# Patient Record
Sex: Female | Born: 1945 | Race: White | Hispanic: No | Marital: Married | Smoking: Former smoker
Health system: Southern US, Community
[De-identification: ages and names within clinical notes are randomized; demographics above are authoritative.]

## PROBLEM LIST (undated history)

## (undated) DIAGNOSIS — E039 Hypothyroidism, unspecified: Secondary | ICD-10-CM

## (undated) DIAGNOSIS — R42 Dizziness and giddiness: Secondary | ICD-10-CM

## (undated) DIAGNOSIS — I639 Cerebral infarction, unspecified: Secondary | ICD-10-CM

## (undated) DIAGNOSIS — Z952 Presence of prosthetic heart valve: Secondary | ICD-10-CM

## (undated) DIAGNOSIS — M25559 Pain in unspecified hip: Secondary | ICD-10-CM

## (undated) DIAGNOSIS — E119 Type 2 diabetes mellitus without complications: Secondary | ICD-10-CM

## (undated) DIAGNOSIS — G8929 Other chronic pain: Secondary | ICD-10-CM

## (undated) DIAGNOSIS — R569 Unspecified convulsions: Secondary | ICD-10-CM

## (undated) DIAGNOSIS — IMO0001 Reserved for inherently not codable concepts without codable children: Secondary | ICD-10-CM

## (undated) DIAGNOSIS — I219 Acute myocardial infarction, unspecified: Secondary | ICD-10-CM

## (undated) DIAGNOSIS — G629 Polyneuropathy, unspecified: Secondary | ICD-10-CM

## (undated) DIAGNOSIS — K219 Gastro-esophageal reflux disease without esophagitis: Secondary | ICD-10-CM

## (undated) DIAGNOSIS — Z86718 Personal history of other venous thrombosis and embolism: Secondary | ICD-10-CM

## (undated) DIAGNOSIS — M797 Fibromyalgia: Secondary | ICD-10-CM

## (undated) HISTORY — PX: CAROTID ENDARTERECTOMY: SUR193

## (undated) HISTORY — PX: CARDIAC SURGERY: SHX584

## (undated) HISTORY — PX: CORONARY ARTERY BYPASS GRAFT: SHX141

## (undated) HISTORY — PX: DE QUERVAIN'S RELEASE: SHX1439

## (undated) HISTORY — DX: Personal history of other venous thrombosis and embolism: Z86.718

## (undated) HISTORY — DX: Presence of prosthetic heart valve: Z95.2

## (undated) HISTORY — PX: CARDIAC CATHETERIZATION: SHX172

## (undated) HISTORY — PX: CHOLECYSTECTOMY: SHX55

## (undated) HISTORY — PX: CARPAL TUNNEL RELEASE: SHX101

---

## 1973-01-13 HISTORY — PX: ABDOMINAL HYSTERECTOMY: SHX81

## 1991-01-14 DIAGNOSIS — R569 Unspecified convulsions: Secondary | ICD-10-CM

## 1991-01-14 HISTORY — DX: Unspecified convulsions: R56.9

## 1994-01-13 DIAGNOSIS — I219 Acute myocardial infarction, unspecified: Secondary | ICD-10-CM

## 1994-01-13 HISTORY — DX: Acute myocardial infarction, unspecified: I21.9

## 2002-11-30 ENCOUNTER — Other Ambulatory Visit: Payer: Self-pay

## 2002-12-03 ENCOUNTER — Other Ambulatory Visit: Payer: Self-pay

## 2004-02-23 ENCOUNTER — Ambulatory Visit: Payer: Self-pay | Admitting: Internal Medicine

## 2004-02-29 ENCOUNTER — Ambulatory Visit: Payer: Self-pay | Admitting: Internal Medicine

## 2004-07-19 ENCOUNTER — Ambulatory Visit: Payer: Self-pay | Admitting: Internal Medicine

## 2004-09-25 ENCOUNTER — Ambulatory Visit: Payer: Self-pay | Admitting: General Practice

## 2004-09-25 ENCOUNTER — Other Ambulatory Visit: Payer: Self-pay

## 2004-10-02 ENCOUNTER — Ambulatory Visit: Payer: Self-pay | Admitting: General Practice

## 2004-12-26 ENCOUNTER — Ambulatory Visit: Payer: Self-pay | Admitting: General Practice

## 2005-03-10 ENCOUNTER — Ambulatory Visit: Payer: Self-pay | Admitting: Family

## 2005-06-06 ENCOUNTER — Other Ambulatory Visit: Payer: Self-pay

## 2005-06-06 ENCOUNTER — Inpatient Hospital Stay: Payer: Self-pay | Admitting: Internal Medicine

## 2005-09-30 ENCOUNTER — Ambulatory Visit: Payer: Self-pay | Admitting: Gastroenterology

## 2005-10-04 ENCOUNTER — Other Ambulatory Visit: Payer: Self-pay

## 2005-10-04 ENCOUNTER — Emergency Department: Payer: Self-pay | Admitting: Unknown Physician Specialty

## 2005-12-01 ENCOUNTER — Emergency Department: Payer: Self-pay | Admitting: Emergency Medicine

## 2005-12-01 ENCOUNTER — Other Ambulatory Visit: Payer: Self-pay

## 2005-12-02 ENCOUNTER — Ambulatory Visit: Payer: Self-pay | Admitting: Cardiovascular Disease

## 2006-08-21 ENCOUNTER — Ambulatory Visit: Payer: Self-pay | Admitting: General Practice

## 2006-10-14 ENCOUNTER — Ambulatory Visit: Payer: Self-pay | Admitting: General Practice

## 2007-01-02 ENCOUNTER — Emergency Department: Payer: Self-pay | Admitting: Emergency Medicine

## 2007-02-12 ENCOUNTER — Other Ambulatory Visit: Payer: Self-pay

## 2007-02-12 ENCOUNTER — Inpatient Hospital Stay: Payer: Self-pay | Admitting: Cardiovascular Disease

## 2007-04-05 ENCOUNTER — Ambulatory Visit: Payer: Self-pay | Admitting: General Practice

## 2007-10-15 ENCOUNTER — Observation Stay: Payer: Self-pay | Admitting: Internal Medicine

## 2007-10-15 ENCOUNTER — Other Ambulatory Visit: Payer: Self-pay

## 2007-10-20 ENCOUNTER — Ambulatory Visit: Payer: Self-pay | Admitting: Internal Medicine

## 2007-11-05 HISTORY — PX: REPLACEMENT TOTAL KNEE: SUR1224

## 2008-11-13 ENCOUNTER — Ambulatory Visit: Payer: Self-pay | Admitting: Cardiovascular Disease

## 2009-04-04 HISTORY — PX: REPLACEMENT TOTAL KNEE: SUR1224

## 2009-05-07 ENCOUNTER — Encounter: Payer: Self-pay | Admitting: Orthopedic Surgery

## 2009-12-14 ENCOUNTER — Ambulatory Visit: Payer: Self-pay | Admitting: Internal Medicine

## 2010-01-01 ENCOUNTER — Ambulatory Visit: Payer: Self-pay | Admitting: Gastroenterology

## 2010-06-27 ENCOUNTER — Emergency Department: Payer: Self-pay | Admitting: Emergency Medicine

## 2010-09-04 ENCOUNTER — Emergency Department: Payer: Self-pay | Admitting: Emergency Medicine

## 2010-09-23 ENCOUNTER — Inpatient Hospital Stay: Payer: Self-pay | Admitting: Internal Medicine

## 2010-09-23 ENCOUNTER — Emergency Department: Payer: Self-pay | Admitting: Emergency Medicine

## 2011-01-14 DIAGNOSIS — I639 Cerebral infarction, unspecified: Secondary | ICD-10-CM

## 2011-01-14 HISTORY — DX: Cerebral infarction, unspecified: I63.9

## 2011-10-22 ENCOUNTER — Ambulatory Visit: Payer: Self-pay | Admitting: Pain Medicine

## 2011-10-22 ENCOUNTER — Other Ambulatory Visit: Payer: Self-pay | Admitting: Pain Medicine

## 2011-10-22 LAB — SEDIMENTATION RATE: Erythrocyte Sed Rate: 6 mm/hr (ref 0–20)

## 2011-10-22 LAB — FOLATE: Folic Acid: >100 ng/mL — ABNORMAL HIGH (ref 3.1–100.0)

## 2011-12-08 ENCOUNTER — Ambulatory Visit: Payer: Self-pay | Admitting: Pain Medicine

## 2011-12-31 ENCOUNTER — Ambulatory Visit: Payer: Self-pay | Admitting: Pain Medicine

## 2012-02-02 ENCOUNTER — Ambulatory Visit: Payer: Self-pay | Admitting: Pain Medicine

## 2012-02-10 LAB — URINALYSIS, COMPLETE
Bacteria: NONE SEEN
Bilirubin,UR: NEGATIVE
Blood: NEGATIVE
Glucose,UR: NEGATIVE mg/dL (ref 0–75)
Ketone: NEGATIVE
Nitrite: NEGATIVE
RBC,UR: 1 /HPF (ref 0–5)
Specific Gravity: 1.006 (ref 1.003–1.030)
WBC UR: 1 /HPF (ref 0–5)

## 2012-02-10 LAB — APTT: Activated PTT: 29.6 secs (ref 23.6–35.9)

## 2012-02-10 LAB — COMPREHENSIVE METABOLIC PANEL
Albumin: 3.7 g/dL (ref 3.4–5.0)
Alkaline Phosphatase: 129 U/L (ref 50–136)
Bilirubin,Total: 0.3 mg/dL (ref 0.2–1.0)
Calcium, Total: 9.1 mg/dL (ref 8.5–10.1)
Chloride: 103 mmol/L (ref 98–107)
Co2: 28 mmol/L (ref 21–32)
Creatinine: 1.32 mg/dL — ABNORMAL HIGH (ref 0.60–1.30)
Glucose: 137 mg/dL — ABNORMAL HIGH (ref 65–99)
Osmolality: 282 (ref 275–301)
SGOT(AST): 33 U/L (ref 15–37)
SGPT (ALT): 36 U/L (ref 12–78)
Sodium: 139 mmol/L (ref 136–145)
Total Protein: 7.1 g/dL (ref 6.4–8.2)

## 2012-02-10 LAB — PROTIME-INR
INR: 1
Prothrombin Time: 13.2 secs (ref 11.5–14.7)

## 2012-02-10 LAB — CBC
HCT: 40.2 % (ref 35.0–47.0)
HGB: 13.1 g/dL (ref 12.0–16.0)
MCH: 31 pg (ref 26.0–34.0)
MCHC: 32.7 g/dL (ref 32.0–36.0)
MCV: 95 fL (ref 80–100)
RBC: 4.23 10*6/uL (ref 3.80–5.20)
WBC: 7.8 10*3/uL (ref 3.6–11.0)

## 2012-02-10 LAB — TROPONIN I: Troponin-I: 0.03 ng/mL

## 2012-02-11 ENCOUNTER — Inpatient Hospital Stay: Payer: Self-pay | Admitting: Internal Medicine

## 2012-02-13 LAB — BASIC METABOLIC PANEL
Anion Gap: 10 (ref 7–16)
BUN: 18 mg/dL (ref 7–18)
Calcium, Total: 8.3 mg/dL — ABNORMAL LOW (ref 8.5–10.1)
Chloride: 105 mmol/L (ref 98–107)
Creatinine: 1.14 mg/dL (ref 0.60–1.30)
Glucose: 146 mg/dL — ABNORMAL HIGH (ref 65–99)
Osmolality: 284 (ref 275–301)
Potassium: 3.8 mmol/L (ref 3.5–5.1)
Sodium: 140 mmol/L (ref 136–145)

## 2012-03-03 ENCOUNTER — Inpatient Hospital Stay: Payer: Self-pay | Admitting: Internal Medicine

## 2012-03-03 LAB — COMPREHENSIVE METABOLIC PANEL
Albumin: 3.5 g/dL (ref 3.4–5.0)
Chloride: 99 mmol/L (ref 98–107)
Co2: 30 mmol/L (ref 21–32)
Creatinine: 1.33 mg/dL — ABNORMAL HIGH (ref 0.60–1.30)
EGFR (African American): 48 — ABNORMAL LOW
Osmolality: 275 (ref 275–301)
SGOT(AST): 103 U/L — ABNORMAL HIGH (ref 15–37)
SGPT (ALT): 59 U/L (ref 12–78)

## 2012-03-03 LAB — CBC
HGB: 12.9 g/dL (ref 12.0–16.0)
MCH: 31.1 pg (ref 26.0–34.0)
MCV: 95 fL (ref 80–100)
Platelet: 178 10*3/uL (ref 150–440)
RDW: 14.5 % (ref 11.5–14.5)
WBC: 13.8 10*3/uL — ABNORMAL HIGH (ref 3.6–11.0)

## 2012-03-03 LAB — TROPONIN I: Troponin-I: <0.02 ng/mL

## 2012-03-03 LAB — TSH: Thyroid Stimulating Horm: 0.838 u[IU]/mL

## 2012-03-04 LAB — CBC WITH DIFFERENTIAL/PLATELET
Basophil #: 0 10*3/uL (ref 0.0–0.1)
Eosinophil #: 0.1 10*3/uL (ref 0.0–0.7)
Eosinophil %: 1.2 %
HCT: 37.7 % (ref 35.0–47.0)
MCH: 31.2 pg (ref 26.0–34.0)
MCV: 94 fL (ref 80–100)
Monocyte #: 0.1 x10 3/mm — ABNORMAL LOW (ref 0.2–0.9)
Platelet: 201 10*3/uL (ref 150–440)
RBC: 4 10*6/uL (ref 3.80–5.20)
WBC: 10.7 10*3/uL (ref 3.6–11.0)

## 2012-03-04 LAB — BASIC METABOLIC PANEL
Anion Gap: 10 (ref 7–16)
BUN: 19 mg/dL — ABNORMAL HIGH (ref 7–18)
Co2: 30 mmol/L (ref 21–32)
EGFR (Non-African Amer.): 40 — ABNORMAL LOW
Glucose: 192 mg/dL — ABNORMAL HIGH (ref 65–99)
Osmolality: 279 (ref 275–301)

## 2012-03-04 LAB — CK TOTAL AND CKMB (NOT AT ARMC)
CK, Total: 930 U/L — ABNORMAL HIGH (ref 21–215)
CK-MB: 2.3 ng/mL (ref 0.5–3.6)

## 2012-03-04 LAB — MAGNESIUM: Magnesium: 2.5 mg/dL — ABNORMAL HIGH

## 2012-03-04 LAB — PRO B NATRIURETIC PEPTIDE: B-Type Natriuretic Peptide: 597 pg/mL — ABNORMAL HIGH (ref 0–125)

## 2012-03-06 LAB — BASIC METABOLIC PANEL
Anion Gap: 9 (ref 7–16)
BUN: 29 mg/dL — ABNORMAL HIGH (ref 7–18)
Calcium, Total: 8.8 mg/dL (ref 8.5–10.1)
Chloride: 98 mmol/L (ref 98–107)
Osmolality: 287 (ref 275–301)
Potassium: 3.5 mmol/L (ref 3.5–5.1)
Sodium: 140 mmol/L (ref 136–145)

## 2012-03-10 ENCOUNTER — Ambulatory Visit: Payer: Self-pay | Admitting: Vascular Surgery

## 2012-03-10 LAB — BASIC METABOLIC PANEL
Anion Gap: 8 (ref 7–16)
BUN: 27 mg/dL — ABNORMAL HIGH (ref 7–18)
Calcium, Total: 10.1 mg/dL (ref 8.5–10.1)
Chloride: 99 mmol/L (ref 98–107)
Creatinine: 1.98 mg/dL — ABNORMAL HIGH (ref 0.60–1.30)
Osmolality: 277 (ref 275–301)
Potassium: 3.6 mmol/L (ref 3.5–5.1)
Sodium: 135 mmol/L — ABNORMAL LOW (ref 136–145)

## 2012-03-12 ENCOUNTER — Observation Stay: Payer: Self-pay | Admitting: Internal Medicine

## 2012-03-12 LAB — COMPREHENSIVE METABOLIC PANEL
Alkaline Phosphatase: 81 U/L (ref 50–136)
BUN: 24 mg/dL — ABNORMAL HIGH (ref 7–18)
Bilirubin,Total: 0.3 mg/dL (ref 0.2–1.0)
Calcium, Total: 9.3 mg/dL (ref 8.5–10.1)
Chloride: 98 mmol/L (ref 98–107)
Co2: 37 mmol/L — ABNORMAL HIGH (ref 21–32)
EGFR (Non-African Amer.): 30 — ABNORMAL LOW
Glucose: 114 mg/dL — ABNORMAL HIGH (ref 65–99)
Osmolality: 279 (ref 275–301)
Potassium: 3.4 mmol/L — ABNORMAL LOW (ref 3.5–5.1)
Sodium: 137 mmol/L (ref 136–145)

## 2012-03-12 LAB — CBC
HGB: 12.3 g/dL (ref 12.0–16.0)
MCH: 31 pg (ref 26.0–34.0)
MCHC: 33.1 g/dL (ref 32.0–36.0)
MCV: 94 fL (ref 80–100)
Platelet: 168 10*3/uL (ref 150–440)
RBC: 3.96 10*6/uL (ref 3.80–5.20)
RDW: 14.2 % (ref 11.5–14.5)
WBC: 11.3 10*3/uL — ABNORMAL HIGH (ref 3.6–11.0)

## 2012-03-12 LAB — TROPONIN I: Troponin-I: <0.02 ng/mL

## 2012-03-12 LAB — CK TOTAL AND CKMB (NOT AT ARMC)
CK, Total: 41 U/L (ref 21–215)
CK-MB: 1.4 ng/mL (ref 0.5–3.6)

## 2012-03-13 LAB — CK TOTAL AND CKMB (NOT AT ARMC)
CK, Total: 39 U/L (ref 21–215)
CK-MB: 1.7 ng/mL (ref 0.5–3.6)
CK-MB: 1.8 ng/mL (ref 0.5–3.6)

## 2012-03-13 LAB — TROPONIN I
Troponin-I: <0.02 ng/mL
Troponin-I: <0.02 ng/mL

## 2012-03-22 ENCOUNTER — Observation Stay: Payer: Self-pay | Admitting: Internal Medicine

## 2012-03-22 LAB — CBC
MCH: 31.1 pg (ref 26.0–34.0)
MCV: 93 fL (ref 80–100)
Platelet: 178 10*3/uL (ref 150–440)
RBC: 3.59 10*6/uL — ABNORMAL LOW (ref 3.80–5.20)

## 2012-03-22 LAB — URINALYSIS, COMPLETE
Bacteria: NONE SEEN
Glucose,UR: NEGATIVE mg/dL (ref 0–75)
Ketone: NEGATIVE
Leukocyte Esterase: NEGATIVE
Nitrite: NEGATIVE
Protein: NEGATIVE
RBC,UR: 1 /HPF (ref 0–5)
Specific Gravity: 1.011 (ref 1.003–1.030)
Squamous Epithelial: NONE SEEN
WBC UR: <1 /HPF (ref 0–5)

## 2012-03-22 LAB — CK: CK, Total: 160 U/L (ref 21–215)

## 2012-03-22 LAB — COMPREHENSIVE METABOLIC PANEL
Albumin: 3.1 g/dL — ABNORMAL LOW (ref 3.4–5.0)
Anion Gap: 8 (ref 7–16)
Calcium, Total: 8.6 mg/dL (ref 8.5–10.1)
Chloride: 101 mmol/L (ref 98–107)
Creatinine: 2.2 mg/dL — ABNORMAL HIGH (ref 0.60–1.30)
EGFR (Non-African Amer.): 23 — ABNORMAL LOW
Potassium: 4.6 mmol/L (ref 3.5–5.1)
SGPT (ALT): 18 U/L (ref 12–78)
Sodium: 132 mmol/L — ABNORMAL LOW (ref 136–145)
Total Protein: 7 g/dL (ref 6.4–8.2)

## 2012-03-22 LAB — CK-MB: CK-MB: 9.7 ng/mL — ABNORMAL HIGH (ref 0.5–3.6)

## 2012-03-22 LAB — TROPONIN I: Troponin-I: <0.02 ng/mL

## 2012-03-23 LAB — BASIC METABOLIC PANEL
Anion Gap: 4 — ABNORMAL LOW (ref 7–16)
BUN: 23 mg/dL — ABNORMAL HIGH (ref 7–18)
Calcium, Total: 7.9 mg/dL — ABNORMAL LOW (ref 8.5–10.1)
Chloride: 110 mmol/L — ABNORMAL HIGH (ref 98–107)
Creatinine: 1.55 mg/dL — ABNORMAL HIGH (ref 0.60–1.30)
EGFR (African American): 40 — ABNORMAL LOW
EGFR (Non-African Amer.): 35 — ABNORMAL LOW
Glucose: 129 mg/dL — ABNORMAL HIGH (ref 65–99)
Osmolality: 281 (ref 275–301)
Potassium: 3.9 mmol/L (ref 3.5–5.1)
Sodium: 138 mmol/L (ref 136–145)

## 2012-03-23 LAB — CK-MB: CK-MB: 3.3 ng/mL (ref 0.5–3.6)

## 2012-03-23 LAB — TROPONIN I: Troponin-I: <0.02 ng/mL

## 2012-03-24 LAB — BASIC METABOLIC PANEL
Anion Gap: 11 (ref 7–16)
BUN: 14 mg/dL (ref 7–18)
Calcium, Total: 7.8 mg/dL — ABNORMAL LOW (ref 8.5–10.1)
Chloride: 109 mmol/L — ABNORMAL HIGH (ref 98–107)
Co2: 18 mmol/L — ABNORMAL LOW (ref 21–32)
Creatinine: 1.28 mg/dL (ref 0.60–1.30)
EGFR (Non-African Amer.): 44 — ABNORMAL LOW
Glucose: 121 mg/dL — ABNORMAL HIGH (ref 65–99)
Osmolality: 277 (ref 275–301)
Potassium: 4.2 mmol/L (ref 3.5–5.1)

## 2012-04-07 ENCOUNTER — Ambulatory Visit: Payer: Self-pay | Admitting: Vascular Surgery

## 2012-04-07 LAB — CBC
HCT: 36.3 % (ref 35.0–47.0)
MCH: 31.1 pg (ref 26.0–34.0)
MCV: 93 fL (ref 80–100)
RBC: 3.89 10*6/uL (ref 3.80–5.20)

## 2012-04-07 LAB — BASIC METABOLIC PANEL
BUN: 28 mg/dL — ABNORMAL HIGH (ref 7–18)
Calcium, Total: 8.4 mg/dL — ABNORMAL LOW (ref 8.5–10.1)
Creatinine: 1.59 mg/dL — ABNORMAL HIGH (ref 0.60–1.30)
EGFR (African American): 39 — ABNORMAL LOW
EGFR (Non-African Amer.): 33 — ABNORMAL LOW
Glucose: 151 mg/dL — ABNORMAL HIGH (ref 65–99)
Potassium: 4.5 mmol/L (ref 3.5–5.1)

## 2012-04-21 ENCOUNTER — Inpatient Hospital Stay: Payer: Self-pay | Admitting: Vascular Surgery

## 2012-04-22 LAB — BASIC METABOLIC PANEL
Anion Gap: 5 — ABNORMAL LOW (ref 7–16)
Calcium, Total: 7.5 mg/dL — ABNORMAL LOW (ref 8.5–10.1)
Chloride: 105 mmol/L (ref 98–107)
Co2: 30 mmol/L (ref 21–32)
EGFR (African American): 40 — ABNORMAL LOW
EGFR (Non-African Amer.): 34 — ABNORMAL LOW
Osmolality: 282 (ref 275–301)
Potassium: 3.7 mmol/L (ref 3.5–5.1)

## 2012-04-22 LAB — CBC WITH DIFFERENTIAL/PLATELET
Basophil #: 0 10*3/uL (ref 0.0–0.1)
Basophil %: 0.2 %
Eosinophil %: 1.9 %
MCHC: 33.7 g/dL (ref 32.0–36.0)
Monocyte #: 0.4 x10 3/mm (ref 0.2–0.9)
Monocyte %: 6.2 %
Neutrophil %: 74.4 %
WBC: 6.3 10*3/uL (ref 3.6–11.0)

## 2012-04-22 LAB — PROTIME-INR
INR: 1.1
Prothrombin Time: 14.7 secs (ref 11.5–14.7)

## 2012-04-30 ENCOUNTER — Ambulatory Visit: Payer: Self-pay | Admitting: Pain Medicine

## 2012-06-02 ENCOUNTER — Ambulatory Visit: Payer: Self-pay | Admitting: Pain Medicine

## 2012-08-30 ENCOUNTER — Ambulatory Visit: Payer: Self-pay | Admitting: Pain Medicine

## 2012-09-09 ENCOUNTER — Ambulatory Visit: Payer: Self-pay | Admitting: Internal Medicine

## 2012-12-17 ENCOUNTER — Ambulatory Visit: Payer: Self-pay | Admitting: Pain Medicine

## 2013-03-23 ENCOUNTER — Ambulatory Visit: Payer: Self-pay | Admitting: Pain Medicine

## 2013-04-06 ENCOUNTER — Ambulatory Visit: Payer: Self-pay | Admitting: Neurology

## 2013-04-06 ENCOUNTER — Observation Stay: Payer: Self-pay | Admitting: Internal Medicine

## 2013-04-06 LAB — COMPREHENSIVE METABOLIC PANEL
ALK PHOS: 90 U/L
ALT: 23 U/L (ref 12–78)
Albumin: 3.7 g/dL (ref 3.4–5.0)
Anion Gap: 4 — ABNORMAL LOW (ref 7–16)
BILIRUBIN TOTAL: 0.3 mg/dL (ref 0.2–1.0)
BUN: 25 mg/dL — ABNORMAL HIGH (ref 7–18)
CALCIUM: 9.2 mg/dL (ref 8.5–10.1)
CHLORIDE: 98 mmol/L (ref 98–107)
Co2: 34 mmol/L — ABNORMAL HIGH (ref 21–32)
Creatinine: 1.86 mg/dL — ABNORMAL HIGH (ref 0.60–1.30)
EGFR (African American): 32 — ABNORMAL LOW
GFR CALC NON AF AMER: 28 — AB
Glucose: 87 mg/dL (ref 65–99)
Osmolality: 276 (ref 275–301)
Potassium: 4.3 mmol/L (ref 3.5–5.1)
SGOT(AST): 31 U/L (ref 15–37)
Sodium: 136 mmol/L (ref 136–145)
Total Protein: 6.9 g/dL (ref 6.4–8.2)

## 2013-04-06 LAB — URINALYSIS, COMPLETE
BACTERIA: NONE SEEN
Bilirubin,UR: NEGATIVE
Blood: NEGATIVE
GLUCOSE, UR: NEGATIVE mg/dL (ref 0–75)
Ketone: NEGATIVE
Leukocyte Esterase: NEGATIVE
NITRITE: NEGATIVE
PROTEIN: NEGATIVE
Ph: 6 (ref 4.5–8.0)
RBC,UR: <1 /HPF (ref 0–5)
SPECIFIC GRAVITY: 1.009 (ref 1.003–1.030)
Squamous Epithelial: <1
WBC UR: <1 /HPF (ref 0–5)

## 2013-04-06 LAB — APTT: ACTIVATED PTT: 29.8 s (ref 23.6–35.9)

## 2013-04-06 LAB — CBC WITH DIFFERENTIAL/PLATELET
BASOS PCT: 0.8 %
Basophil #: 0.1 10*3/uL (ref 0.0–0.1)
EOS PCT: 2.8 %
Eosinophil #: 0.2 10*3/uL (ref 0.0–0.7)
HCT: 40.5 % (ref 35.0–47.0)
HGB: 13.6 g/dL (ref 12.0–16.0)
LYMPHS ABS: 2.9 10*3/uL (ref 1.0–3.6)
Lymphocyte %: 35.3 %
MCH: 31.4 pg (ref 26.0–34.0)
MCHC: 33.4 g/dL (ref 32.0–36.0)
MCV: 94 fL (ref 80–100)
MONOS PCT: 11 %
Monocyte #: 0.9 x10 3/mm (ref 0.2–0.9)
Neutrophil #: 4.1 10*3/uL (ref 1.4–6.5)
Neutrophil %: 50.1 %
Platelet: 180 10*3/uL (ref 150–440)
RBC: 4.31 10*6/uL (ref 3.80–5.20)
RDW: 14.9 % — ABNORMAL HIGH (ref 11.5–14.5)
WBC: 8.1 10*3/uL (ref 3.6–11.0)

## 2013-04-06 LAB — PROTIME-INR
INR: 1
Prothrombin Time: 12.9 secs (ref 11.5–14.7)

## 2013-04-06 LAB — DRUG SCREEN, URINE

## 2013-04-06 LAB — TROPONIN I: Troponin-I: <0.02 ng/mL

## 2013-04-06 LAB — AMMONIA: Ammonia, Plasma: 25 mcmol/L (ref 11–32)

## 2013-07-08 ENCOUNTER — Ambulatory Visit: Payer: Self-pay | Admitting: Pain Medicine

## 2013-07-13 DIAGNOSIS — E65 Localized adiposity: Secondary | ICD-10-CM | POA: Insufficient documentation

## 2013-07-27 ENCOUNTER — Ambulatory Visit: Payer: Self-pay | Admitting: Pain Medicine

## 2013-08-08 ENCOUNTER — Emergency Department: Payer: Self-pay | Admitting: Emergency Medicine

## 2013-08-08 LAB — URINALYSIS, COMPLETE
Bilirubin,UR: NEGATIVE
Blood: NEGATIVE
Glucose,UR: NEGATIVE mg/dL (ref 0–75)
KETONE: NEGATIVE
Nitrite: NEGATIVE
PROTEIN: NEGATIVE
Ph: 8 (ref 4.5–8.0)
RBC,UR: 1 /HPF (ref 0–5)
Specific Gravity: 1.009 (ref 1.003–1.030)
Squamous Epithelial: <1
Transitional Epi: <1
WBC UR: 6 /HPF (ref 0–5)

## 2013-08-12 ENCOUNTER — Ambulatory Visit: Payer: Self-pay | Admitting: Pain Medicine

## 2013-08-24 ENCOUNTER — Ambulatory Visit: Payer: Self-pay | Admitting: Pain Medicine

## 2013-08-24 LAB — BASIC METABOLIC PANEL
ANION GAP: 3 — AB (ref 7–16)
BUN: 26 mg/dL — ABNORMAL HIGH (ref 7–18)
CALCIUM: 9.5 mg/dL (ref 8.5–10.1)
CO2: 34 mmol/L — AB (ref 21–32)
Chloride: 95 mmol/L — ABNORMAL LOW (ref 98–107)
Creatinine: 1.83 mg/dL — ABNORMAL HIGH (ref 0.60–1.30)
EGFR (Non-African Amer.): 28 — ABNORMAL LOW
GFR CALC AF AMER: 33 — AB
GLUCOSE: 299 mg/dL — AB (ref 65–99)
Osmolality: 280 (ref 275–301)
Potassium: 4.6 mmol/L (ref 3.5–5.1)
Sodium: 132 mmol/L — ABNORMAL LOW (ref 136–145)

## 2013-08-24 LAB — CBC WITH DIFFERENTIAL/PLATELET
BASOS PCT: 0.4 %
Basophil #: 0 10*3/uL (ref 0.0–0.1)
Eosinophil #: 0.1 10*3/uL (ref 0.0–0.7)
Eosinophil %: 1 %
HCT: 38.3 % (ref 35.0–47.0)
HGB: 12.3 g/dL (ref 12.0–16.0)
LYMPHS PCT: 13.5 %
Lymphocyte #: 1.2 10*3/uL (ref 1.0–3.6)
MCH: 30.3 pg (ref 26.0–34.0)
MCHC: 32 g/dL (ref 32.0–36.0)
MCV: 95 fL (ref 80–100)
MONO ABS: 0.4 x10 3/mm (ref 0.2–0.9)
Monocyte %: 4.7 %
NEUTROS PCT: 80.4 %
Neutrophil #: 7.4 10*3/uL — ABNORMAL HIGH (ref 1.4–6.5)
PLATELETS: 171 10*3/uL (ref 150–440)
RBC: 4.05 10*6/uL (ref 3.80–5.20)
RDW: 15.1 % — AB (ref 11.5–14.5)
WBC: 9.2 10*3/uL (ref 3.6–11.0)

## 2013-08-25 ENCOUNTER — Ambulatory Visit: Payer: Self-pay | Admitting: Pain Medicine

## 2013-08-30 ENCOUNTER — Ambulatory Visit: Payer: Self-pay | Admitting: Pain Medicine

## 2013-09-02 LAB — PATHOLOGY REPORT

## 2013-09-23 ENCOUNTER — Ambulatory Visit: Payer: Self-pay | Admitting: Pain Medicine

## 2013-09-27 ENCOUNTER — Ambulatory Visit: Payer: Self-pay | Admitting: Pain Medicine

## 2013-10-17 ENCOUNTER — Ambulatory Visit: Payer: Self-pay | Admitting: Pain Medicine

## 2013-10-20 ENCOUNTER — Ambulatory Visit: Payer: Self-pay | Admitting: Pain Medicine

## 2013-10-27 ENCOUNTER — Ambulatory Visit: Payer: Self-pay | Admitting: Pain Medicine

## 2013-11-29 ENCOUNTER — Emergency Department: Payer: Self-pay | Admitting: Emergency Medicine

## 2013-11-29 ENCOUNTER — Ambulatory Visit: Payer: Self-pay | Admitting: Pain Medicine

## 2013-11-29 LAB — CBC WITH DIFFERENTIAL/PLATELET
BASOS ABS: 0 10*3/uL (ref 0.0–0.1)
Basophil %: 0.5 %
Eosinophil #: 0.2 10*3/uL (ref 0.0–0.7)
Eosinophil %: 2.6 %
HCT: 40.9 % (ref 35.0–47.0)
HGB: 13 g/dL (ref 12.0–16.0)
LYMPHS ABS: 1.5 10*3/uL (ref 1.0–3.6)
LYMPHS PCT: 21 %
MCH: 29.4 pg (ref 26.0–34.0)
MCHC: 31.9 g/dL — ABNORMAL LOW (ref 32.0–36.0)
MCV: 92 fL (ref 80–100)
MONO ABS: 0.5 x10 3/mm (ref 0.2–0.9)
MONOS PCT: 7.4 %
Neutrophil #: 5 10*3/uL (ref 1.4–6.5)
Neutrophil %: 68.5 %
Platelet: 165 10*3/uL (ref 150–440)
RBC: 4.43 10*6/uL (ref 3.80–5.20)
RDW: 15.9 % — ABNORMAL HIGH (ref 11.5–14.5)
WBC: 7.3 10*3/uL (ref 3.6–11.0)

## 2013-11-29 LAB — BASIC METABOLIC PANEL
Anion Gap: 3 — ABNORMAL LOW (ref 7–16)
BUN: 19 mg/dL — AB (ref 7–18)
CO2: 35 mmol/L — AB (ref 21–32)
CREATININE: 1.67 mg/dL — AB (ref 0.60–1.30)
Calcium, Total: 9.1 mg/dL (ref 8.5–10.1)
Chloride: 98 mmol/L (ref 98–107)
GFR CALC AF AMER: 39 — AB
GFR CALC NON AF AMER: 32 — AB
GLUCOSE: 184 mg/dL — AB (ref 65–99)
OSMOLALITY: 279 (ref 275–301)
Potassium: 4.1 mmol/L (ref 3.5–5.1)
Sodium: 136 mmol/L (ref 136–145)

## 2013-11-29 LAB — TROPONIN I: Troponin-I: <0.02 ng/mL

## 2013-12-22 DIAGNOSIS — F329 Major depressive disorder, single episode, unspecified: Secondary | ICD-10-CM | POA: Insufficient documentation

## 2013-12-22 DIAGNOSIS — Z8673 Personal history of transient ischemic attack (TIA), and cerebral infarction without residual deficits: Secondary | ICD-10-CM | POA: Insufficient documentation

## 2013-12-22 DIAGNOSIS — I2510 Atherosclerotic heart disease of native coronary artery without angina pectoris: Secondary | ICD-10-CM | POA: Insufficient documentation

## 2013-12-22 DIAGNOSIS — F32A Depression, unspecified: Secondary | ICD-10-CM | POA: Insufficient documentation

## 2014-01-06 ENCOUNTER — Emergency Department: Payer: Self-pay | Admitting: Emergency Medicine

## 2014-02-02 ENCOUNTER — Emergency Department: Payer: Self-pay | Admitting: Emergency Medicine

## 2014-02-11 ENCOUNTER — Emergency Department: Payer: Self-pay | Admitting: Emergency Medicine

## 2014-05-05 NOTE — Discharge Summary (Signed)
PATIENT NAME:  Pamela Mcmahon, Pamela Mcmahon MR#:  [id number] DATE OF BIRTH:  04/26/1945  DATE OF ADMISSION:  03/03/2012 DATE OF DISCHARGE:  03/07/2012  HISTORY OF PRESENT ILLNESS: The patient is a 69 year old white lady who came to the Emergency Room with a chief complaint of shortness of breath associated with cough. She had cough with yellow sputum production. She had also had nausea and vomiting. She had been on amoxicillin as an outpatient for 2 days with no improvement. In the Emergency Room, the patient was found to be hypoxic on room air. Chest x-ray revealed mild interstitial edema as well as a possible interstitial infiltrate. The patient was given Lasix and IV Levaquin in the ER. She was also given IV steroids.   The patient had recently been admitted to the hospital with what was felt to be an acute CVA. She was found to have high-grade stenosis of the right carotid artery. She had been seen in consultation by vascular surgery and was being considered for possible stent placement.   PAST MEDICAL HISTORY: Type 2 diabetes, hypertension, history of CVA, carotid artery stenosis, coronary artery disease, hyperlipidemia, chronic pain syndrome, hypothyroidism, chronic anxiety and depression.   PAST SURGICAL HISTORY: Previous coronary artery bypass graft.   DRUG ALLERGIES: CODEINE.   MEDICATIONS AT HOME: Alprazolam 0.5 mg once daily, amitriptyline 100 mg daily, furosemide 40 mg daily, Glyset 25 mg b.i.d., isosorbide 30 mg daily, Levoxyl 50 mcg daily, metformin 1000 mg b.i.d., metoprolol succinate 25 mg daily, omeprazole 40 mg daily, paroxetine 40 mg daily, Percocet 7.5/325, 1 tablet every 6 hours as needed for pain; Plavix 75 mg daily, potassium chloride 20 mEq daily, simvastatin 40 mg at bedtime and zolpidem 10 mg at bedtime p.r.n.   ADMISSION PHYSICAL EXAMINATION: As described by the admitting physician revealed temperature of 100.5, pulse 88, respirations 22 and blood pressure 118/53. Pulse oximetry was 95%  on 4 liters. Exam as described by the physician who admitted her was notable for a right carotid bruit. Auscultation of the chest revealed crackles and rales. Cardiac exam was felt to be unremarkable. The patient had no peripheral edema.   ADMISSION LABORATORY: Showed a blood sugar of 111. BNP of 719. BUN of 20, creatinine of 1.33, sodium of 136, potassium of 5.1, chloride of 99, CO2 of 30, an estimated GFR of 42, anion gap of 7. Magnesium was 0.9. Alkaline phosphatase 146, AST 103, ALT 15. Troponin was less than 0.02. TSH was 0.838. White count was 13,800, hemoglobin was 12.9 with a hematocrit of 39.3, platelet count was 178,000. EKG showed a sinus tachycardia with left axis deviation and incomplete right bundle branch block. Chest x-ray revealed diffuse interstitial infiltrates and possible interstitial edema.   HOSPITAL COURSE: The patient was admitted to the regular medical floor where she was placed on telemetry. She was treated with IV Lasix for her congestive heart failure. She was felt to have a healthcare-associated pneumonia, as she had recently been in the hospital, and she was placed on both Zosyn and levofloxacin. Her hypomagnesemia was replaced with supplements. Her acute renal insufficiency was felt to be due to dehydration, and she was rehydrated with IV fluids. She was continued on aspirin and statin for her heart disease. The patient's hospital course was one of slow but gradual improvement. She was eventually weaned from oxygen supplement and ambulating.   DISCHARGE DIAGNOSES:  1.  Acute respiratory failure secondary to acute systolic congestive heart failure.  2.  Health facility-acquired pneumonia.   DISCHARGE MEDICATIONS:  1.  Furosemide 40 mg daily.  2.  Glyset 25 mg b.i.d.  3.  Levoxyl 50 mcg daily.  4.  Zolpidem 10 mg at bedtime p.r.n.  5.  Imdur 30 mg daily.  6.  Metoprolol succinate 25 mg daily.  7.  Omeprazole 40 mg daily.  8.  Simvastatin 40 mg at bedtime.  9.   Metformin 1000 mg b.i.d.  10.  Amitriptyline 100 mg at bedtime.  11.  Alprazolam 0.5 mg at bedtime.  12.  Fluoxetine 40 mg daily.  13.  Percocet 7.5/325, 1 tablet every 6 hours as needed for pain.  14.  Potassium chloride 20 mEq daily.  15.  Nitroglycerin 0.4 mg sublingual tablets p.r.n.  16.  Levofloxacin 750 mg every 48 hours.   PLAN: The patient was advised to stop taking her Plavix. She was also taken off of her Augmentin.   DISCHARGE DISPOSITION: The patient was discharged home with home health. She was discharged on a low-sodium, carbohydrate-controlled diet. She is to have activity as tolerated. She will be followed up in the office in 2 to 4 weeks.    ____________________________ Letta PateJohn B. Danne HarborWalker III, MD jbw:lo D: 03/23/2012 10:11:18 ET T: 03/23/2012 11:05:50 ET JOB#: [id number]  cc: Letta PateJohn B. Danne HarborWalker III, MD, <Dictator> Elmo PuttJOHN B WALKER III MD ELECTRONICALLY SIGNED 03/23/2012 12:50

## 2014-05-05 NOTE — Discharge Summary (Signed)
PATIENT NAME:  Pamela Mcmahon, Pamela Mcmahon MR#:  [id number] DATE OF BIRTH:  1945/05/19  DATE OF ADMISSION:  03/22/2012 DATE OF DISCHARGE:  03/24/2012  The patient is a 69 year old white lady who had recently been admitted to the hospital with a CVA. She had been readmitted for possible stent placement in the right carotid that was unsuccessful, and she went back home, to be brought back for a carotid endarterectomy. However, on the morning of admission she was found on the floor at home by her husband after no apparent syncopal episode. She was brought to the Emergency Room where she was found to be hypotensive and dehydrated. Her creatinine had gone from 1.6 to 2.3. The patient was therefore admitted to observation for IV rehydration.   The patient'Mcmahon past medical history was notable for coronary artery disease, with a previous, CABG procedure. The patient had type 2 diabetes, with a history of peripheral neuropathy. The patient had known chronic anxiety, depression. As noted, she had had a recent CVA and was found to have significant carotid artery disease. The patient was also known to have hyperlipidemia, hypothyroidism, and gastroesophageal reflux disease.   Surgical history included a left knee replacement, a right knee arthroscopy, a colon resection, a hysterectomy, and a previous cholecystectomy.   MEDICATIONS ON ADMISSION: Included Zolpidem 10 mg at bedtime p.r.n., simvastatin 40 mg at bedtime, Zetia 10 mg daily, Ranolazine 500 mg b.i.d., potassium chloride 20 mEq daily, Paxil 40 mg daily, Protonix 40 mg daily, oxycodone 1 tablet 3 times a day as needed, nitroglycerin 0.4 mg sublingual p.r.n., nitroglycerin 0.4 mg topical daily, metoprolol 25 mg daily, Levoxyl 50 mg daily, Glyset 25 mg b.i.d., Lasix 40 mg daily, Plavix 75 mg daily, vitamin D 2000 units daily, amitriptyline 100 mg at bedtime, Apresoline 0.5 mg at bedtime.    Patient'Mcmahon admission vital signs nor physical exam were described by the admitting  physician in his admission note.   HOSPITAL COURSE: The patient was admitted to the regular floor where she was rehydrated with IV fluids. She was also seen by Physical Therapy, who recommended rehab as she had been having multiple falls at home.     The patient'Mcmahon admission CBC showed a hemoglobin of 11.1 with a hematocrit of 33.2. White count was 12,500. Platelet count was 178,000.   Admission comprehensive metabolic panel showed a blood sugar of 108, a BUN of 34, creatinine of 2.2, a sodium of 132, an albumin of 3.1, and an estimated GFR of 23. Troponin on admission was normal. Following rehydration, the patient'Mcmahon basic metabolic panel showed a BUN of 14 with a creatinine of 1.28. Chloride was 109. CO2 was 18. Calcium was 7.8. Estimated GFR was 44.   DISCHARGE DIAGNOSIS: Dehydration.   IMPRESSION: Acute renal insufficiency, secondary to dehydration.   DISCHARGE MEDICATIONS: 1.  Glyset 25 mg b.i.d.  2.  Levoxyl 50 mg daily.  3.  Zolpidem 10 mg at bedtime, p.r.n.  4.  Protonix 40 mg daily.  5.  Ranexa 500 mg b.i.d.  6.  Paroxetine 40 mg daily.  7.  Metoprolol succinate 25 mg daily.  8.  Amitriptyline 100 mg at bedtime.  9.  Simvastatin 40 mg at bedtime.  10.  Zetia 10 mg daily.  11.  Plavix 75 mg daily.  12.  Alprazolam 0.5 mg at bedtime   13.  Calciferol 200 units daily.  14.  Oxycodone 5 mg 3 times a day, as needed.  15.  Senna 1 tablet twice a day.  16.  Colace 100 mg twice a day.   The patient'Mcmahon diuretic and potassium supplement were discontinued.      Patient is being discharged on a low-sodium, low-fat, diabetic diet. She is to have activity as tolerated.   The patient is a FULL CODE.   It is anticipated that she will return home in 1 to 2 weeks.     ____________________________ Letta Pate Danne Harbor, MD jbw:dm D: 03/24/2012 12:39:43 ET T: 03/24/2012 13:46:40 ET JOB#: [id number]  cc: Letta Pate. Danne Harbor, MD, <Dictator> Elmo Putt III MD ELECTRONICALLY SIGNED  04/05/2012 20:57

## 2014-05-05 NOTE — Consult Note (Signed)
General Aspect critical stenosis of the right ICA    Present Illness The patient is a 69 year old female, who presents to the Emergency Room due to sudden onset of left-sided arm weakness and slurred speech approximately two days ago. The patient said she woke up this morning, and shortly after breakfast around 10:00, she started feeling that her left arm was not working very well. She called her husband, and the husband said that he could not understand what she was saying, that she was speaking in a garbled speech manner. Her speech since then has improved, but she continues to have left-sided arm weakness. She denies any numbness in the left upper extremity, denies any left lower extremity weakness or any left-sided facial weakness. She denies any headache, any nausea, vomiting, chest pain, shortness of breath or any other associated symptoms. Work up has included a CT angiogram which is read as >75% stenosis.  PAST MEDICAL HISTORY:  Consistent with diabetes, hypertension, history of previous CVA, history of coronary artery disease, status post CABG, hyperlipidemia, chronic pain syndrome, hypothyroidism, anxiety/depression.   Home Medications: Medication Instructions Status  Plavix 75 mg oral tablet 1 tab(s) orally once a day  Active  amitriptyline 100 mg oral tablet 1 tab(s) orally once a day (at bedtime) Active  fluoxetine 20 mg oral capsule 1 cap(s) orally once a day Active  furosemide 40 mg oral tablet 1 tab(s) orally once a day Active  Glyset 25 mg oral tablet 1 tab(s) orally 2 times a day Active  levothyroxine 50 mcg (0.05 mg) oral tablet 1 tab(s) orally once a day Active  nitroglycerin 0.4 mg sublingual tablet 1 tab(s) sublingual every 5 minutes as needed up to 3 doses for chest pain. **if no relief call md or go to emergency room** Active  alprazolam 0.5 mg oral tablet 1 tab(s) orally 3 times a day Active  zolpidem 10 mg oral tablet 1 tab(s) orally once a day (at bedtime) Active   isosorbide mononitrate 30 mg oral tablet, extended release 1 tab(s) orally once a day Active  metoprolol succinate 25 mg oral tablet, extended release 1 tab(s) orally once a day (in the morning) Active  omeprazole 40 mg oral delayed release capsule 1 cap(s) orally once a day Active  simvastatin 40 mg oral tablet 1 tab(s) orally once a day (at bedtime) Active  aspirin 81 mg oral tablet 1 tab(s) orally once a day Active  metformin 1000 mg oral tablet 1 tab(s) orally 2 times a day Active  oxycodone 5 mg oral tablet 1 tab(s) orally 1 to 4 times a day, As Needed- for Pain  Active  potassium chloride 20 mEq oral tablet, extended release 1 tab(s) orally once a day Active  Vitamin D3 2000 intl units oral tablet 1 tab(s) orally once a day Active    Codeine: N/V  Case History:   Family History Non-Contributory    Social History positive  tobacco, negative ETOH, negative Illicit drugs   Review of Systems:   Fever/Chills No    Cough No    Sputum No    Abdominal Pain No    Diarrhea No    Constipation No    Nausea/Vomiting No    SOB/DOE No    Chest Pain No    Telemetry Reviewed NSR    Dysuria No   Physical Exam:   GEN well developed, well nourished, no acute distress    HEENT pink conjunctivae, PERRL, hearing intact to voice    NECK supple  trachea midline    RESP normal resp effort  no use of accessory muscles    CARD regular rate  positive carotid bruits  no JVD    ABD denies tenderness  denies Flank Tenderness  soft    EXTR negative cyanosis/clubbing, negative edema    SKIN No rashes, skin turgor poor    NEURO cranial nerves intact, L side weakness, follows commands    PSYCH alert, good insight   Nursing/Ancillary Notes: **Vital Signs.:   31-Jan-14 04:02   Vital Signs Type Routine   Temperature Temperature (F) 98.2   Celsius 36.7   Temperature Source Oral   Pulse Pulse 70   Respirations Respirations 18   Systolic BP Systolic BP 133   Diastolic BP  (mmHg) Diastolic BP (mmHg) 65   Mean BP 87   Pulse Ox % Pulse Ox % 92   Pulse Ox Activity Level  At rest   Oxygen Delivery Room Air/ 21 %   Routine Chem:  31-Jan-14 04:54    Glucose, Serum  146   BUN 18   Creatinine (comp) 1.14   Sodium, Serum 140   Potassium, Serum 3.8   Chloride, Serum 105   CO2, Serum 25   Calcium (Total), Serum  8.3   Anion Gap 10 (Result(s) reported on 13 Feb 2012 at 05:17AM.)   Osmolality (calc) 284     Impression 1.  A cerebrovascular accident.  The CT angiogram shows a 90% stenosis of the right ICA by my read.  This is a symptomatic lesion and given the patient's persistent left arm weakness she has had a stroke.  I would rest her brain for 2-3 weeks.  She will need cardiac clearance, she sees Dr Welton Flakes, and pending the results of his evaluation right CEA vs stenting.  Continue antiplatelet therapy as ordered.  2.  Diabetes. Continue metformin and sliding scale insulin for now.  3.  Hypertension. Continue Toprol, continue Imdur.  4.  Hyperlipidemia. Continue Simvastatin.  5.  Hypothyroidism. Continue Synthroid.  6.  History of coronary artery disease status post coronary artery bypass graft. No acute chest pain. Continue aspirin, Plavix, Toprol and Simvastatin.  7.  Chronic pain syndrome. This is likely secondary to a neuropathy from diabetes. Continue oxycodone for pain    Plan level 4 consult   Electronic Signatures: Levora Dredge (MD)  (Signed 31-Jan-14 18:23)  Authored: General Aspect/Present Illness, Home Medications, Allergies, History and Physical Exam, Vital Signs, Labs, Impression/Plan   Last Updated: 31-Jan-14 18:23 by Levora Dredge (MD)

## 2014-05-05 NOTE — H&P (Signed)
PATIENT NAME:  Pamela Mcmahon, Pamela Mcmahon MR#:  [id number] DATE OF BIRTH:  1945/05/01  DATE OF ADMISSION:  03/03/2012  PRIMARY CARE PHYSICIAN:  Dr. Yates DecampJohn Walker, III.  REFERRING PHYSICIAN:  Dr. Enedina FinnerGoli.   CHIEF COMPLAINT:  Shortness of breath and cough.   HISTORY OF PRESENT ILLNESS:  The patient is a 69 year old female came into the ER with a chief complaint of shortness of breath associated with cough.  She was short of breath since yesterday associated with productive cough with yellowish phlegm.  Also, patient has nauseated and vomited yesterday.  Her shortness of breath has become worse today and she has failed on amoxicillin as an outpatient for the past two days with no improvement regarding her cough.  The patient was hypoxemic on room air initially and she was having in mid 80s regarding which she was placed on oxygen by nasal cannula and uptitrated to 4 liters where she was sating at 93% to 95%.  Chest x-ray has revealed mild interstitial edema as well as interstitial infiltrate.  The patient has received levofloxacin IV in the ER and Lasix was also given in the ER.  Paramedics gave her albuterol neb treatments and Solu-Medrol 62.5 mg IV.  The patient was recently admitted to Dr. Tedra CoupeJohn Walker'Mcmahon service on 02/10/2012 for left arm weakness and slurry speech.  At the time of admission she was diagnosed with high-grade right carotid artery stenosis during which she was followed by Dr. Welton FlakesKhan last Monday who has recommended her to be seen by vascular surgeon for further evaluation and for possible stent placement.  During my examination, patient'Mcmahon shortness of breath is significantly improved.  She denies any chest pain.  Denies any dizziness or loss of consciousness.  No family members at bedside.  She denied any fevers.  Denies any sick contacts either.   PAST MEDICAL HISTORY:  Diabetes, hypertension, history of previous CVA, recent diagnosis of a high grade right carotid stenosis, history of coronary artery disease,  status post CABG, hyperlipidemia, chronic pain syndrome, hypothyroidism, anxiety and depression.   PAST SURGICAL HISTORY:  Coronary artery bypass grafting.   ALLERGIES:  The patient is allergic to CODEINE.   HOME MEDICATIONS:  Alprazolam 0.5 mg 1 tablet once a day, amitriptyline 100 mg once daily,  furosemide 40 mg once daily, Glyset 25 mg 1 tablet twice a day, isosorbide mononitrate 1 tablet once a day, Levothyroxine 50 mcg once daily, metformin 1000 mg by mouth 2 times a day, metoprolol succinate 25 mg 1 tablet once a day, omeprazole 40 mg once a day, paroxetine 40 mg once daily, Percocet 7.5/325 1 tablet by mouth q. 6 hours as needed for pain, Plavix 75 mg once daily, potassium chloride 20 mEq by mouth once daily, simvastatin 40 mg once daily, zolpidem 10 mg once daily.   PSYCHOSOCIAL HISTORY:  The patient has a 50 pack-year history, but currently she is denying any smoking.  Denies alcohol or illicit drug usage.  Lives with husband.   FAMILY HISTORY:  Father died from acute MI complications.  Mother died from colon cancer.    REVIEW OF SYSTEMS:  CONSTITUTIONAL:  Denies any fever or pain.  EYES:  No blurry vision, glaucoma, cataracts.  EARS, NOSE, THROAT:  Denies tinnitus.  No postnasal drip.  No difficulty in swallowing. RESPIRATIONS:  Complaining of productive cough with yellowish phlegm.  Positive shortness of breath.  Denies any painful respiration.  CARDIOVASCULAR:  Denies any chest pain, but complaining of shortness of breath.  Denies any palpitations  or edema.  GASTROINTESTINAL:  Nausea and vomiting for one day yesterday.  Denies any abdominal pain or diarrhea.  GENITOURINARY:  No dysuria, hematuria.  GYNECOLOGIC AND BREASTS:  No breast mass, vaginal discharge.  ENDOCRINE:  No polyuria, nocturia.  HEMATOLOGIC AND LYMPHATIC:  Denies anemia or easy bruising.  INTEGUMENTARY:  No acne, rash, lesions.  MUSCULOSKELETAL:  Denies any neck pain, back pain, shoulder pain.  Denies any gout.   NEUROLOGIC:  Recent history of stroke.  Denies ataxia, vertigo.  PSYCHIATRIC:  Has history of anxiety and depression.  Denies any ADD or OCD.   PHYSICAL EXAMINATION: VITAL SIGNS:  Temperature 100.5, pulse 88, respirations 22, blood pressure 118/53, pulse oximetry 95% on 4 liters.  GENERAL APPEARANCE:  Not under acute distress, moderately built and moderately nourished.  HEENT:  Normocephalic, atraumatic.  Pupils are equally reacting to light and accommodation.  No scleral icterus.  NECK:  Supple.  No thyromegaly.  Positive right carotid bruit.  LUNGS:  Positive crackles and rales.  No accessory muscle usage.  No anterior chest wall tenderness.  CARDIAC:  S1, S2 normal.  Regular rate and rhythm.  No murmurs.  GASTROINTESTINAL:  Soft.  Bowel sounds are positive in all four quadrants.  Nontender, nondistended.  No masses felt.  NEUROLOGIC:  Awake, alert, oriented x 3.  Cranial nerves II through XII are grossly intact.  Reflexes are 2+.  EXTREMITIES:  No edema.  No cyanosis.  No clubbing.  SKIN:  Warm to touch.  Normal turgor.  No rashes.  No lesions.  MUSCULOSKELETAL:  No joint effusion.  No tenderness.   LABORATORY AND IMAGING STUDIES:  Glucose 111.  BNP 719, BUN 20, creatinine 1.33, sodium 136, potassium 5.1, chloride is 99, CO2 30, GFR is 42, anion gap 7, osmolality 275, calcium 10.1, magnesium 0.9, alkaline phosphatase 146, AST 103, ALT 15.  Troponin less than 0.02.  TSH is 0.838, WBC 13.8, hemoglobin 12.9, hematocrit 39.3, platelet count is 178, MCV 95, MCH 31.1.  ABGs, pH 7.37, pCO2 54, pO2 is 58, base excess 44.5, bicarb is 31.2 on FiO2 32%.  A 12-lead EKG has revealed sinus tachycardia at 91 beats per minute, normal PR and QRS interval, left axis deviation, incomplete right bundle branch block.  Chest x-ray has revealed diffuse interstitial infiltrate and possible interstitial edema.   ASSESSMENT AND PLAN: 1.  Acute respiratory distress with hypoxemia probably from healthcare-associated  pneumonia and acute exacerbation of congestive heart failure.  2.  Acute exacerbation of diastolic congestive heart failure as patient'Mcmahon recent echocardiogram has revealed normal ejection fraction per Dr. Milta Deiters note.  We will monitor her daily weights.  We will provide her with continue oxygen via nasal cannula to maintain her sats at 93%.  Lasix 40 mg IV q. 12 hours.  We will give her aspirin, Plavix, beta-blocker and statin.  We will continue Imdur.  We will provide nitroglycerin sublingually as needed basis for chest pain.  The patient is currently denying any chest pain.  3.  Pneumonia.  This is most likely healthcare-associated pneumonia as patient was just recently admitted to hospital at end of January and failed on outpatient antibiotics.  We will provide her Zosyn and IV levofloxacin.  Sputum culture and sensitivity is ordered.  4.  Hypomagnesemia, probably from nausea and vomiting.  Magnesium 4 grams IV was ordered in the ER.  We will continue that and check magnesium in a.m. and if necessary we will replete as needed basis.  5.  Acute renal insufficiency, probably  prerenal from intractable nausea and vomiting.  The patient has mild congestive heart failure.  We hold off on the IV fluids for now and avoid nephrotoxins.  We will monitor renal function closely and if necessary we will provide her gentle hydration if the renal function is getting worse.  6.  High-grade right internal carotid artery stenosis.  The patient was evaluated by Dr. Welton Flakes who has recommended the patient to be seen by vascular surgeon for possible stent placement.  7.  History of coronary artery disease, stroke.  We will provide her aspirin and statin and check fasting lipid panel in a.m.  We will check BMP in a.m.  8.  History of diabetes mellitus.  We will hold off on the metformin currently in view of acute renal insufficiency.  We will put her on sliding scale.  9.  Hypertension.  We will resume her home medications and  titrate as needed basis.  10.  We will provide her gastrointestinal and deep vein thrombosis prophylaxis.   CODE STATUS:  SHE IS FULL CODE.   We will turn over the patient to [facility] in a.m.   The diagnosis and plan of care was discussed in detail with the patient.  She is aware of the plan.   TOTAL TIME SPENT ON THE ADMISSION:  Is 60 minutes.      ____________________________ Ramonita Lab, MD ag:ea D: 03/03/2012 23:45:14 ET T: 03/04/2012 02:04:27 ET JOB#: [id number]  cc: Ramonita Lab, MD, <Dictator> Ramonita Lab MD ELECTRONICALLY SIGNED 03/04/2012 6:56

## 2014-05-05 NOTE — Consult Note (Signed)
PATIENT NAME:  Pamela Mcmahon, Pamela Mcmahon MR#:  [id number] DATE OF BIRTH:  11-27-1945  DATE OF CONSULTATION:  03/13/2012  REFERRING PHYSICIAN:   CONSULTING PHYSICIAN:  Laurier NancyShaukat A. [place] Cloe, MD  HISTORY OF PRESENT ILLNESS:  This is a 69 year old white female with a past medical history of CABG, hypertension, type 2 diabetes, hypothyroidism, hyperlipidemia who came into the Emergency Room yesterday after calling 911 because she was having prolonged episode of chest pain. She says she had a very severe episode of chest pain. She called 911 night.  It lasted for an hour. Currently, she is denies any chest pain and was comfortably sleeping.   PAST MEDICAL HISTORY: As mentioned above.   ALLERGIES:   1.  CODEINE. 2.  TRICOR. 3.  VICODIN.  MEDICATIONS:  Aspirin, Plavix, Zetia/simvastatin, Lasix, isosorbide 30 mg, metoprolol extended release,  Ranexa  b.i.d. 500.   FAMILY HISTORY: Positive for coronary artery disease.   SOCIAL HISTORY: She smokes about 3cigarettes a day. Denies EtOH abuse.   PHYSICAL EXAMINATION: GENERAL: She is alert, oriented x 3, in no acute distress.  VITAL SIGNS: Temperature 98.3, pulse 76, respirations 20, blood pressure 110/61, pulse oximetry 93.  NECK: No JVD.  LUNGS: Clear.  HEART: Regular rate and rhythm. Normal S1, S2. No audible murmur.  ABDOMEN: Soft, nontender, positive bowel sounds.  EXTREMITIES: No pedal edema.  NEUROLOGIC: She has right-sided weakness.     LABORATORY AND DIAGNOSTIC DATA: EKG shows sinus rhythm, 75 beats per minute, left axis deviation, low voltage, nonspecific ST-T changes. Cardiac enzymes are negative. BUN and creatinine is slightly elevated.   ASSESSMENT AND PLAN: Is atypical chest pain. Myocardial infarction has been ruled out. The patient has peripheral vascular disease, was supposed to get carotid endarterectomy which has not been done by Dr. Gilda CreaseSchnier yet. She had a recent cerebrovascular accident. I advised conservative treatment with increasing of the  isosorbide to 30 b.i.d. and may be discharged with a follow-up in the office 3:00 on Monday.   Thank you very much for the referral.    ____________________________ Laurier NancyShaukat A. Odelle Kosier, MD sak:ct D: 03/13/2012 08:24:18 ET T: 03/13/2012 09:02:13 ET JOB#: [id number]  cc: Laurier NancyShaukat A. Shamere Campas, MD, <Dictator> Laurier NancySHAUKAT A Juma Oxley MD ELECTRONICALLY SIGNED 04/06/2012 9:04

## 2014-05-05 NOTE — Discharge Summary (Signed)
Dates of Admission and Diagnosis:   Date of Admission 11-Feb-2012    Date of Discharge 14-Feb-2012    Admitting Diagnosis stroke    Final Diagnosis stroke    Discharge Diagnosis 1 high grade right carotid stenosis    2 dm ii     Chief Complaint/History of Present Illness see h and p     Routine Chem:  31-Jan-14 04:54    Glucose, Serum  146   BUN 18   Creatinine (comp) 1.14   Sodium, Serum 140   Potassium, Serum 3.8   Chloride, Serum 105   CO2, Serum 25   Calcium (Total), Serum  8.3   Anion Gap 10 (Result(s) reported on 13 Feb 2012 at 05:17AM.)   Osmolality (calc) 284   PERTINENT RADIOLOGY STUDIES: XRay:    28-Jan-14 15:36, Chest Portable Single View   Chest Portable Single View    REASON FOR EXAM:    CVA  COMMENTS:       PROCEDURE: DXR - DXR PORTABLE CHEST SINGLE VIEW  - Feb 10 2012  3:36PM     RESULT: Comparison is made to the study dated 23 September 2010.    Sternotomy wires are present. The lungs are clear. The heart and   pulmonary vessels are normal. The bony and mediastinal structures are   unremarkable. There is no effusion. There is no pneumothorax or evidence   of congestive failure.    IMPRESSION:  No acute cardiopulmonary disease.    Dictation Site: 2    Verified By: Elveria Royals, M.D., MD  [place]:    29-Jan-14 15:11, US Carotid Doppler Bilateral   US Carotid Doppler Bilateral    REASON FOR EXAM:    CVa.  Left arm weakness.  COMMENTS:       PROCEDURE: [place]  - US CAROTID DOPPLER BILATERAL  - Feb 11 2012  3:11PM     RESULT: Grayscale and color flow Doppler techniques were employed to   evaluate the cervical carotid systems.    Both right and left carotid systems reveal moderate amounts of calcified   and soft plaque. On the right in the ICA the degree of plaque is greater   than that on the left and may approach 90 to 95% stenosis. The waveform   patterns of the right ICA exhibits turbulence.  On the left no   significant turbulence is  demonstrated. On the right the peak internal   carotid systolic velocity was markedly elevated at 304 cm/sec with the   peak common carotid velocity measuring 61 cm/sec corresponding to an     abnormal ratio of 5. On the left the peak internal carotid systolic   velocity measured 110 cm/sec and the peak common carotid velocity   measured 80 cm/sec corresponding to a normal ratio of 1.4. The vertebral   arteries are normal in flow direction bilaterally.    IMPRESSION:  The findings are consistent with a right proximal ICA   stenosis in the range of 75 to 95%. On the left I do not see evidence of   significant stenotic lesion. Followup CT angiography or MR angiography   would be useful to better define the degree of stenosis on the right.     Dictation Site: 2        Verified By: DAVID A. [place], M.D., MD  CT:    28-Jan-14 15:29, CT Head Without Contrast   CT Head Without Contrast    REASON FOR EXAM:    CVA  COMMENTS:   May transport without cardiac monitor    PROCEDURE: CT  - CT HEAD WITHOUT CONTRAST  - Feb 10 2012  3:29PM     RESULT: Axial noncontrast CT scanning was performed through the brain   with reconstructions at 5 mm intervals and slice thicknesses.    There is mild age-appropriate diffuse cerebral atrophy. The ventricles   are normal in size and position. There is no intracranial hemorrhage nor   intracranial mass effect. There is decreased density in the deep white   matter of both cerebral hemispheres consistent with chronic small vessel   ischemia. There is no objective evidence of an evolving ischemic   infarction. The cerebellum and brainstem exhibit no acute abnormalities.   At bone window settings the calvarium appears intact. The observed     portions of the paranasal sinuses exhibit no air-fluid levels. There is   mucoperiosteal thickening inferiorly in the left maxillary sinus.    IMPRESSION:   1. There are white matter density changes consistent with  chronic small   vessel ischemia. These are new since the CT scan of the brain of Jun 06, 2005.  2. There is no evidence of an acute ischemic or hemorrhagic infarction or   other intracranial hemorrhagic process.  3. There is no hydrocephalus nor intracranial mass effect.  4. There is no evidence of an acute skull fracture.    CT scanning is relatively insensitive to the presence of acute ischemic   change in the first 12 to 24 hours post event.   Dictation Site: 1        Verified By: DAVID A. [place], M.D., MD    31-Jan-14 10:58, CT Angiography Neck (Carotids)   CT Angiography Neck (Carotids)    REASON FOR EXAM:    right carotid stenosis  COMMENTS:       PROCEDURE: CT  - CT ANGIOGRAPHY NECK W/CONTRAST  - Feb 13 2012 10:58AM     RESULT:     Technique: Axial 3 mm source images were obtained status post intravenous   administration of 75 mL Isovue-370.    Findings: Evaluation of the left carotid system demonstrates coarse mural   calcifications within the carotid bulb. Asymmetric calcifications are   identified within the proximal internal carotid artery. Within the   carotid bulb an area of mild to moderate stenosis is appreciated with   maximal narrowing of 56%. There is otherwise no evidence of     hemodynamically significant stenosis appreciated. There is no evidence of   focal outpouchings, fusiform dilatation or segmental stenosis.    Evaluation of the right carotid system demonstrates coarse mural   calcifications within the carotid bulb. There is an area concerning for   high-grade stenosis within the distal carotid bulb extending into the   origin of the internal carotid artery. This area demonstrates areas of   stenosis of 75% or greater. There are also findings concerning for an   ulcerative plaque component.    No further regions of focal outpouchings, fusiform dilatation, segmental   narrowing is identified.    IMPRESSION:    1. Findings concerning for  high-grade stenosis within the carotid bulb   extending into the origin of the internal carotid artery on the right.  2. Mild to moderate stenosis within the carotid bulb on the left as   described above.  3. Further evaluation with Vascular Interventional Surgical consultation   is recommended.    Thank you for the  opportunity to contribute to the care of your patient.         Verified By: Jani FilesHECTOR W. COOPER, M.D., MD   Hospital Course:   [facility] a left body stroke with arm weakness. Today is the first day I have seen her. She is improving per her and female relative with her. Carotid stenosis reviewed and vascular notes, wants to operate in 2-3 weeks and have her cardiologist see her prior to the surgery. She is anxious to go home, is ambulating well and her arm is functioning much better. Note it took 36 minutes for dc tasks today    Condition on Discharge Satisfactory   DISCHARGE INSTRUCTIONS HOME MEDS:  Medication Reconciliation:  Patient's Home Medications at Discharge:     Medication Instructions  plavix 75 mg oral tablet  1 tab(s) orally once a day    amitriptyline 100 mg oral tablet  1 tab(s) orally once a day (at bedtime)   fluoxetine 20 mg oral capsule  1 cap(s) orally once a day   furosemide 40 mg oral tablet  1 tab(s) orally once a day   glyset 25 mg oral tablet  1 tab(s) orally 2 times a day   levothyroxine 50 mcg (0.05 mg) oral tablet  1 tab(s) orally once a day   nitroglycerin 0.4 mg sublingual tablet  1 tab(s) sublingual every 5 minutes as needed up to 3 doses for chest pain. **if no relief call md or go to emergency room**   alprazolam 0.5 mg oral tablet  1 tab(s) orally 3 times a day   zolpidem 10 mg oral tablet  1 tab(s) orally once a day (at bedtime)   isosorbide mononitrate 30 mg oral tablet, extended release  1 tab(s) orally once a day   metoprolol succinate 25 mg oral tablet, extended release  1 tab(s) orally once a day (in the  morning)   omeprazole 40 mg oral delayed release capsule  1 cap(s) orally once a day   simvastatin 40 mg oral tablet  1 tab(s) orally once a day (at bedtime)   aspirin 81 mg oral tablet  1 tab(s) orally once a day   metformin 1000 mg oral tablet  1 tab(s) orally 2 times a day   oxycodone 5 mg oral tablet  1 tab(s) orally 1 to 4 times a day, As Needed- for Pain    vitamin d3 2000 intl units oral tablet  1 tab(s) orally once a day   amitriptyline 100 mg oral tablet  1 tab(s) orally once a day (at bedtime)   fluoxetine 20 mg oral capsule  1 cap(s) orally once a day   simvastatin 40 mg oral tablet  1 tab(s) orally once a day (at bedtime)   furosemide 40 mg oral tablet  1 tab(s) orally once a day   levothyroxine 50 mcg (0.05 mg) oral tablet  1 tab(s) orally once a day   oxycodone 5 mg oral tablet  1 tab(s) orally every 4 to 6 hours, As needed, pain   isosorbide mononitrate 30 mg oral tablet, extended release  1 tab(s) orally once a day   metformin 1000 mg oral tablet  1 tab(s) orally 2 times a day (with meals)   aspirin 81 mg oral delayed release tablet  1 tab(s) orally once a day   clopidogrel 75 mg oral tablet  1 tab(s) orally once a day   metoprolol succinate 25 mg oral tablet, extended release  1 tab(s) orally  once a day   cholecalciferol oral tablet  2000 unit(s) orally once a day   acetaminophen 325 mg oral tablet  2 tab(s) orally every 4 hours, As needed, pain or temp. greater than 100.4     STOP TAKING THE FOLLOWING MEDICATION(S):    potassium chloride 20 meq oral tablet, extended release: 1 tab(s) orally once a day  Physician's Instructions:   Diet Low Fat, Low Cholesterol    Activity Limitations As tolerated    Return to Work Not Applicable    Time frame for Follow Up Appointment next week   Electronic Signatures: Lauro Regulus (MD)  (Signed 01-Feb-14 11:07)  Authored: ADMISSION DATE AND DIAGNOSIS, CHIEF COMPLAINT/HPI, PERTINENT LABS, PERTINENT RADIOLOGY STUDIES,  HOSPITAL COURSE, DISCHARGE INSTRUCTIONS HOME MEDS, PATIENT INSTRUCTIONS   Last Updated: 01-Feb-14 11:07 by Lauro Regulus (MD)

## 2014-05-05 NOTE — H&P (Signed)
PATIENT NAME:  Pamela Mcmahon, FIKE MR#:  [id number] DATE OF BIRTH:  11/24/1945  DATE OF ADMISSION:  03/22/2012  PRIMARY CARE PHYSICIAN:  John B. Sarina Ser, MD  CHIEF COMPLAINT:  Syncopal episode today.   HISTORY OF PRESENT ILLNESS:  The patient is a 69 year old Caucasian female who was recently admitted on March 12, 2012 with unstable angina and discharged on March 13, 2012 after her symptoms resolved. She comes in today because she was found on the floor this morning after she had a syncopal episode. The patient does not remember much, however. All she remembers is she was trying to get up and the next thing she was found lying on the floor for a couple of hours. Her husband came over to help her get up to the couch. Home health nurse came to check on her and she was told she has "low blood pressure" and needs to be checked in the Emergency Room. The patient presented to the Emergency Room and had relative hypotension with blood pressure of 101/52. She looks clinically dehydrated and her creatinine is up from 1.6 to 2.30. She is getting IV fluids and is being admitted for syncope suspected from hypotension and dehydration. The patient denies any chest pain. She is complaining of generalized body aches. She did have a small bruise below her left eye. She is being admitted for further evaluation and management.   PAST MEDICAL HISTORY:   1.  Coronary artery disease status post CABG.  2.  Chronic angina. Follows up with Dr. Neoma Laming.  3.  Anxiety.  4.  Type 2 diabetes with peripheral neuropathy and history of falls.  5.  Left knee replacement.  6.  Right knee arthroscopy.  7.  Colon resection.  8.  History of cholecystectomy.  9.  Hysterectomy.  10.  Right critical carotid artery stenosis. She underwent angiogram per Dr. Delana Meyer and lesion is not amenable for stent and hence is going to require endarterectomy in the next couple of weeks.  11.  Hyperlipidemia.  12.  GERD.  13.  Hypothyroidism.   14.  History of CVA/stroke.   MEDICATIONS:   1.  Zolpidem 10 mg at bedtime.  2.  Simvastatin 40 mg daily at bedtime.  3.  Zetia 10 mg daily.  4.  Ranolazine 500 mg b.i.d.  5.  Potassium chloride 20 mEq p.o. daily.  6.  Paxil 40 mg daily.  7.  Protonix 40 mg daily.  8.  Oxycodone 5 mg 1 tablet 3 times a day as needed.  9.  Nitroglycerin 0.4 mg sublingual as needed.  10.  Nitroglycerin 0.4 mg topically daily.  11.  Metoprolol 25 mg 1 tablet daily.  12.  Levothyroxine 50 mcg daily.  13.  Glyset 25 mg p.o. b.i.d.  14.  Lasix 40 mg daily.  15.  Plavix 75 mg daily.  16.  Cholecalciferol 2000 International Units 1 tablet daily.  17.  Amitriptyline 100 mg daily at bedtime.  18.  Alprazolam 0.5 mg daily at bedtime.   SOCIAL HISTORY:  Lives at home with her husband. Continues to smoke. Denies alcohol use.   FAMILY HISTORY:  Positive for CAD. Mother died from colon cancer. Father died from complications of MI.   REVIEW OF SYSTEMS:  CONSTITUTIONAL: Positive for generalized body aches. Positive for weakness.  EYES: No blurred or double vision. No glaucoma.  ENT: No tinnitus, ear pain, hearing loss or epistaxis.  RESPIRATORY: No cough, wheeze, hemoptysis, or dyspnea.  CARDIOVASCULAR: No chest pain,  orthopnea, or edema.  GASTROINTESTINAL: No nausea, vomiting, diarrhea, or abdominal pain. Positive for GERD.  GENITOURINARY: No dysuria or hematuria.  ENDOCRINE: No polyuria or nocturia.  HEMATOLOGY: No anemia or easy bruising.  SKIN: Positive for bruise below the left eye.  MUSCULOSKELETAL: Positive for generalized body aches and arthritis.  NEUROLOGIC: No CVA or TIA.  PSYCHIATRIC: Positive for mild anxiety, but no depression or bipolar.   DIAGNOSTIC DATA:  EKG shows normal sinus rhythm, left axis deviation, nonspecific intraventricular block. Urinalysis is negative for UTI. CT of the head shows stable CT with changes of atrophy and chronic microvascular ischemic disease. H and H are 11.1 and  33.2 and white count is 12.5. Creatinine is 2.20, sodium 132, chloride 101, BUN 34. Troponin is less than 0.02. CK total is 160.   ASSESSMENT AND PLAN:  A 69 year old female with a history of coronary artery disease status post coronary artery bypass graft, history of chronic stable angina, type 2 diabetes comes in with:  1.  Syncope which is suspected from dehydration and hypotension. The patient's baseline creatinine is 1.6. She came in with a creatinine of 2.30 clinically dry and taking blood pressure medications along with Lasix. She has a bruise below her left eye, but no other injuries. Has generalized body aches. We will admit the patient for observation for Dr. Jenny Reichmann B. Walker on telemetry floor. We will give about 2 liters of intravenous fluids at this time. Check ins and outs and monitor vitals along with check MET-B in the morning. I will hold blood pressure medications.  2.  Type 2 diabetes. Continue Glyset and will continue sliding scale.  3.  History of hypertension. I will hold metoprolol and nitroglycerin. I will hold it given hypotension and resume if blood pressure remains stable tomorrow.  4.  Hyperlipidemia. Continue simvastatin and Zetia.  5.  Hypothyroidism on Synthroid.  6.  Chronic pain syndrome on p.r.n. oxycodone.  7.  Right carotid artery stenosis. The patient needs to get an endarterectomy since the lesion is not amenable for stenting per Dr. Delana Meyer. The patient underwent selective injection of the right common carotid artery and arch aortogram on February 26th. She has a followup appointment with Dr. Delana Meyer which she is advised to keep after discharge from the hospital.  8.  Deep vein thrombosis prophylaxis. She is on aspirin and Plavix and we will give her some subcutaneous heparin 3 times a day.  9.  Further workup according to the patient's clinical course. Hospital admission plan was discussed with the patient. No family members present in the Emergency Room. Care  management and physical therapy will be consulted while the patient is in house.  10.  The patient is a full code.   TIME SPENT:  50 minutes.    ____________________________ Hart Rochester Posey Pronto, MD sap:si D: 03/22/2012 16:35:00 ET T: 03/22/2012 17:35:18 ET JOB#: [id number]  cc: Hewitt Blade. Sarina Ser, MD Sona A. Posey Pronto, MD, <Dictator>  Ilda Basset MD ELECTRONICALLY SIGNED 03/25/2012 15:09

## 2014-05-05 NOTE — Consult Note (Signed)
Consult dictated, Patient is well known to me and has stable CAD, s/p CABG, and echo shows normal LVEF with mild MR. EKG NSR no acute changes, and denies chest pains. Advise proceeding with surgery in 2 weeks. May stop plavix 5 days before and continue asp . Thanks.  Electronic Signatures: Radene KneeKhan, Zachery Niswander Ali (MD)  (Signed on 31-Jan-14 18:03)  Authored  Last Updated: 31-Jan-14 18:03 by Radene KneeKhan, Madlyn Crosby Ali (MD)

## 2014-05-05 NOTE — Op Note (Signed)
PATIENT NAME:  Pamela Mcmahon, Pamela Mcmahon MR#:  [id number] DATE OF BIRTH:  12/27/1945  DATE OF PROCEDURE:  03/10/2012  PREOPERATIVE DIAGNOSIS: Symptomatic critical stenosis of the right internal carotid artery at the bifurcation.   POSTOPERATIVE DIAGNOSES:  1.  Symptomatic carotid stenosis of the right internal carotid artery at the bifurcation.  2.  Critical stenosis of the mid internal carotid artery at a kink.   PROCEDURES PERFORMED:  1.  Arch aortogram.  2.  Selective injection of the right common carotid artery, cervical and cranial views.   SURGEON: Levora DredgeGregory Case Vassell, M.D.   ASSISTANT: Dr. Wyn Quakerew.   SEDATION: Versed 1 mg plus fentanyl 50 mcg administered IV. Continuous ECG, pulse oximetry and cardiopulmonary monitoring was performed throughout the entire procedure by the interventional radiology nurse. Total sedation time was 50 minutes.   ACCESS: 6-French sheath, right common femoral artery.   FLUOROSCOPY TIME: 4.6 minutes.   CONTRAST: 50 mL.   INDICATIONS: The patient is a 69 year old woman, who presents to the office with a documented TIA and critical stenosis of the right internal carotid artery. CT angiography demonstrated bifurcation lesion that was moderately, but not circumferentially calcified. The aortic arch was favorable and the internal carotid artery appeared to have a 90 degree angulation in its midportion with a fairly long straight segment heading up to the siphon that would be: A)  Relatively straightforward to negotiate with a filter. B)  Provide more than adequate landing zone.  Therefore, in discussion with the patient it was elected to proceed with carotid stenting.   The risks and benefits were reviewed. All questions were answered and the patient has agreed to proceed.   DESCRIPTION OF PROCEDURE: The patient is taken to the special procedure suite, placed in the supine position. After adequate sedation has been achieved, the patient'Mcmahon groins were prepped and draped in a  sterile fashion. Ultrasound is placed in a sterile sleeve. Ultrasound is utilized secondary to lack of appropriate landmarks to avoid vascular injury. Common femoral artery was identified. It was noted to be echolucent, homogeneous, and easily compressible indicating patency. Image is recorded for the permanent record and under direct ultrasound visualization, a micropuncture needle was inserted, Microwire followed by microsheath and J-wire followed by a 6- [place] sheath. Pigtail catheter and J-wire would not advance past the aortic bifurcation and a small hand injection demonstrated a greater than 85% stenosis at the origin of the right common iliac artery. This was then negotiated with a Glidewire and the catheter advanced into the ascending aorta. LAO projection of the aortic arch was then obtained demonstrating a type II arch. A JB-1 catheter and stiff angled Glidewire were then exchanged for the pigtail catheter and the catheter advanced into the common carotid artery.   With the catheter positioned approximately 1 cm below the bifurcation, AP, oblique and lateral views of the cervical carotid were obtained. Subsequently, lateral and Waters views of the intracranial vessels were obtained. After review of these images, in contradistinction to the CT scan, the area that appeared to be a gentle curve on CT was actually a high-grade kink with a greater than 85% stenosis and this is not a favorable situation for stenting with these combined 2 lesions. Therefore, the catheter was removed over a wire, projection of the groin was obtained and a ProGlide device deployed for hemostasis. There were no immediate complications.   INTERPRETATION: The arch is opacified. No significant atherosclerotic changes are noted. No ulcerations are identified. It is type 2 arch. Origins of  the great vessels are widely patent. The common carotid artery on the right is widely patent. The bulb demonstrates a moderately calcified lesion  of approximately 75% to 80% in the lateral projection it is best visualized. In addition, approximately 4 cm above this, there is a rather sharp kink with a greater than 85% to 90% stenosis associated with it. The distal internal carotid artery and siphon are widely patent. Middle cerebral fills, but it is somewhat pruned. There is no filling of the anterior cerebral.   SUMMARY: Tandem lesions with a significant tortuous/kink that would not be a desirable lesion to stent and therefore no further intervention are performed. Consideration for open endarterectomy of the bifurcation with plication and subsequent on table angiography and stenting of the more distal lesion will be discussed with the patient.    ____________________________ Renford Dills, MD ggs:aw D: 03/10/2012 11:42:08 ET T: 03/10/2012 12:10:16 ET JOB#: [id number]  cc: Renford Dills, MD, <Dictator> John B. Danne [place], MD Renford Dills MD ELECTRONICALLY SIGNED 04/13/2012 17:15

## 2014-05-05 NOTE — Consult Note (Signed)
consult dictated, briefely, patient had prolonged episode of chest pain yesterday, and came to ER. Denies any further chest pains. MI is ruled out , no acute EKG changes. May go home with f/u office 3pm monday.  Electronic Signatures: Radene KneeKhan, Karinna Beadles Ali (MD)  (Signed on 01-Mar-14 08:20)  Authored  Last Updated: 01-Mar-14 08:20 by Radene KneeKhan, Alfonzia Woolum Ali (MD)

## 2014-05-05 NOTE — Op Note (Signed)
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  PROCEDURE PERFORMED:   1.  Right carotid endarterectomy with CorMatrix patch angioplasty.  2.  Repair of arterial defect using a xenograft CorMatrix patch.  3.  On-table angiography direct puncture.   SURGEON:  Katha Cabal, MD  CO-SURGEON:  Leotis Pain, MD   ANESTHESIA:  General by endotracheal intubation.   FLUIDS:  Per anesthesia record.   ESTIMATED BLOOD LOSS:  100 mL.   SPECIMEN:  Carotid plaque to pathology for permanent section.   INDICATIONS:  The patient is a 69 year old woman, who sustained a stroke, right hemispheric, approximately 1 month ago and was found to have critical stenosis of the right internal carotid artery. She was subsequently planned for stenting, but at the time of contrast angiography with selection of the carotid system, she was found to have tandem lesions with a high-grade kink in the more distal internal carotid artery. This would not be amenable to intervention, and therefore plans were for formal endarterectomy at the carotid bifurcation with plication to reduce the redundancy and on-table angiography to determine whether further stenting procedures would be indicated. The risks and benefits, as well as the plan have been reviewed in great detail with the patient and her husband. All questions have been answered, and the patient has agreed to proceed.   PROCEDURE:  The patient was taken to the special procedure suite, placed in supine position. After adequate general anesthesia was induced and appropriate invasive monitors were placed, she was positioned with her neck extended slightly and rotated to the left. Neck and chest wall were prepped and draped in a sterile fashion.    A curvilinear incision was created along the anterior margin of the sternocleidomastoid muscle, and the dissection was carried down through the platysma, ligating venous branches as they were encountered. The sternocleidomastoid muscle was then dissected along its anterior margin and reflected posterolaterally. Omohyoid was identified, and the common carotid artery was then localized at this level. Dissection was then carried along the anterior margin of the common carotid artery in a craniad direction, ligating venous tributaries as they were encountered with 3-0 and 2-0 silk ties. Facial vein was ligated and divided between 2-0 silk ties. The carotid bifurcation was identified. The superior thyroid, as well as the external carotid arteries were dissected circumferentially and looped with Silastic vessel loops. Then, 7500 units of heparin was given. The dissection was then carried along the internal carotid artery to a point well above visible plaque formation. This would allow for plication of the internal carotid artery in an attempt to reduce the redundancy and resolve the narrowing noted at that level.   After heparin had been circulating for greater than 4 minutes, the common, followed by the external, followed by the internal carotid artery was clamped. Arteriotomy was made, and an indwelling shunt was placed without difficulty. Flow was re-established to the brain. Endarterectomy was then performed under direct visualization for the common bulb and internal carotid artery. External carotid artery was treated with the eversion technique.   Once the endarterectomy was completed, there was approximately 5 to 7 mm of redundancy, and this was plicated using interrupted 7-0 Prolene sutures. Once this had been completed, a CorMatrix patch was rehydrated on the back table. It was then beveled  and applied to the arterial defect for repair using running 6-0 Prolene in a four-quadrant technique. Copious  irrigation was used throughout the sewing of the patch. The shunt was then removed. The bulb was irrigated with heparinized saline, and then the suture line was completed. Flow was then re-established first to the external carotid artery and then the internal carotid artery to prevent distal embolization.   Using a micropuncture kit, direct puncture of the common carotid artery, approximately 2 cm below the level of patch, was then performed. Wire was advanced, followed by micro sheath, J-wire, and subsequently a 5-French sheath. The detector was then brought into the surgical field, and using hand injection through a manifold, multiple views of the carotid artery obtained. Review of the images demonstrated a widely patent repair with spasm noted in both the external and the more distal internal carotid arteries. The area of redundancy had now been straightened, almost entirely, and there did not appear to be a flow-limiting defect at this level. Therefore, no stenting was performed. A pursestring suture of 6-0 Prolene was placed around the sheath. The sheath was removed. The wound was then inspected for hemostasis. After which, the platysma was then closed with running 3-0 Vicryl, and the skin was closed with 4-0 Monocryl subcuticular. Dermabond was applied. The patient tolerated the procedure well, and there were no immediate complications. Sponge and needle counts were correct, and she was awakened on the table, moving her extremities and obeying simple commands. She was subsequently taken to the recovery room in stable condition.    ____________________________ Katha Cabal, MD ggs:ms D: 04/21/2012 17:14:10 ET T: 04/21/2012 23:20:14 ET JOB#: [id number]  cc: Katha Cabal, MD, <Dictator> John B. Sarina Ser, MD Katha Cabal MD ELECTRONICALLY SIGNED 05/24/2012 13:44

## 2014-05-05 NOTE — H&P (Signed)
DATE OF BIRTH:  1945/02/28  DATE OF ADMISSION:  02/10/2012  PRIMARY CARE PHYSICIAN:  [name].   CHIEF COMPLAINT:  Left arm weakness and slurred speech.   HISTORY OF PRESENT ILLNESS:  This is a 69 year old female, who presents to the Emergency Room due to sudden onset of left-sided arm weakness and slurred speech. The patient said she woke up this morning, and shortly after breakfast around 10:00, she started feeling that her left arm was not working very well. She called her husband, and the husband said that he could not understand what she was saying, that she was speaking in a garbled speech manner. He was therefore a bit concerned and therefore brought her to the ER for further evaluation. Her speech since then has improved, but she continues to have left-sided arm weakness. She denies any numbness in the left upper extremity, denies any left lower extremity weakness or any left-sided facial weakness. She denies any headache, any nausea, vomiting, chest pain, shortness of breath or any other associated symptoms. She did admit that she was having some double vision when she started having left arm weakness, but it has since then improved and resolved. Hospitalist service was contacted for treatment and evaluation.   REVIEW OF SYSTEMS:   CONSTITUTIONAL:  No documented fever. No weight gain or weight loss.  EYES:  No blurry or double vision.  ENT:  No tinnitus. No postnasal drip. No redness of oropharynx.  RESPIRATORY:  No cough. No wheeze. No hemoptysis or dyspnea.  CARDIOVASCULAR:  No chest pain. No orthopnea. No palpitations or syncope.  GASTROINTESTINAL:  No nausea. No vomiting or diarrhea. No abdominal pain. No melena or hematochezia.  GENITOURINARY:  No dysuria or hematuria.  ENDOCRINE:  No polyuria or nocturia. No heat or cold intolerance.  HEMATOLOGIC:  No anemia. No bruising. No bleeding.  INTEGUMENTARY:  No rashes. No lesions.  MUSCULOSKELETAL:  No arthritis. No swelling. No  gout.  NEUROLOGIC:  No numbness. No tingling. No ataxia. Positive left arm weakness. No seizure-type activity.  PSYCHIATRIC:  Positive anxiety. Positive depression. No insomnia. No ADD.   PAST MEDICAL HISTORY:  Consistent with diabetes, hypertension, history of previous CVA, history of coronary artery disease, status post CABG, hyperlipidemia, chronic pain syndrome, hypothyroidism, anxiety/depression.   ALLERGIES:  CODEINE.   SOCIAL HISTORY:  Does smoke about 4 cigarettes per day, has been smoking for the past 50+ years. No alcohol abuse. No illicit drug abuse. Lives at home with her husband.   FAMILY HISTORY:  Mother died from colon cancer. Father died from complications of MI.   CURRENT MEDICATIONS:  Xanax 0.5 mg t.i.d., amitriptyline 100 mg at bedtime, aspirin 81 mg daily, fluoxetine 20 mg daily, Lasix 40 mg daily, Glyset 25 mg b.i.d., Imdur 30 mg daily, Synthroid 50 mcg daily, metformin 1000 mg b.i.d., Toprol 25 mg daily, sublingual nitroglycerin as needed, omeprazole 40 mg daily, oxycodone 5 mg 4 times daily as needed, Plavix 75 mg daily, potassium 10 mEq daily, Simvastatin 40 mg daily, vitamin D3 at 2000 international units daily, and Ambien 10 mg at bedtime.   PHYSICAL EXAMINATION ON ADMISSION:   VITAL SIGNS:  Temperature 98, pulse 75, respirations 23, blood pressure 137/74, sats 99% on room air.  GENERAL:  She is a pleasant appearing female in no apparent distress.  HEENT:  She is atraumatic, normocephalic. Extraocular muscles are intact. Pupils equal and reactive to light. Sclerae are anicteric. No conjunctival injection. No pharyngeal erythema.  NECK:  Supple. There is no  jugular venous distention. No bruits, no lymphadenopathy, no thyromegaly.  HEART:  Regular rate and rhythm. No murmurs, no rubs, no clicks.  LUNGS:  Clear to auscultation bilaterally. No rales, no rhonchi, no wheezes.  ABDOMEN:  Soft, flat, nontender, nondistended. Has good bowel sounds. No hepatosplenomegaly  appreciated.  EXTREMITIES:  No evidence of any cyanosis, clubbing or peripheral edema. Has +2 pedal and radial pulses bilaterally.  NEUROLOGICAL:  The patient is alert, awake, oriented x 3. She has about 3/5 strength in the left upper extremity as compared to the right, 5/5 strength in the left lower extremity. Negative clonus. Babinski's are downgoing. Reflexes are +2.  SKIN:  Moist and warm with no rashes.  LYMPHATIC:  There is no cervical or axillary lymphadenopathy.  LABORATORY EXAMINATION:  Serum glucose of 137, BUN 19, creatinine 1.3, sodium 139, potassium 4, chloride 103, bicarb 28. LFTs are within normal limits. Troponin less than 0.03. White cell count 7.8, hemoglobin 13.1, hematocrit 40.2, platelet count of 172. INR is 1.0.   The patient did have a chest x-ray done, which showed no evidence of any acute cardiopulmonary disease.   The patient also had a CT of the head done without contrast showing white matter density changes consistent with a chronic small vessel ischemia. No acute ischemic or hemorrhagic infarction. No hydrocephalus. No intracranial mass. No acute skull fracture.   ASSESSMENT AND PLAN:  This is a 69 year old female with a history of diabetes, hypertension, history of previous cerebrovascular accident, history of coronary artery disease status post coronary artery bypass grafting, hyperlipidemia, chronic pain syndrome, hypothyroidism, who presents to the hospital with left arm weakness and slurred speech.  1.  A cerebrovascular accident/transient ischemic attack. This is likely the suspected diagnosis given her left-sided arm weakness and her slurred speech, which has now resolved. The patient is on aspirin and Plavix now. We will continue that for now as it is as equivalent as putting her on Aggrenox. Her CT head is negative. We will get an MRI of her brain without contrast, get a carotid duplex and echocardiogram. We will get a physical therapy/occupational therapy consult,  also get a speech evaluation. Follow neuro checks q. 4 hours.  2.  Diabetes. Continue metformin and sliding scale insulin for now.  3.  Hypertension. Continue Toprol, continue Imdur.  4.  Hyperlipidemia. Continue Simvastatin.  5.  Hypothyroidism. Continue Synthroid.  6.  History of coronary artery disease status post coronary artery bypass graft. No acute chest pain. Continue aspirin, Plavix, Toprol and Simvastatin.  7.  Chronic pain syndrome. This is likely secondary to a neuropathy from diabetes. Continue oxycodone for pain.   CODE STATUS:  The patient is a FULL CODE.   TIME SPENT:  45 minutes.    ____________________________ Rolly PancakeVivek J. Cherlynn KaiserSainani, MD vjs:ms D: 02/10/2012 17:53:12 ET T: 02/10/2012 18:11:25 ET JOB#: [id number]  cc: Rolly PancakeVivek J. Cherlynn KaiserSainani, MD, <Dictator> Houston SirenVIVEK J Kerrington Greenhalgh MD ELECTRONICALLY SIGNED 02/11/2012 15:35

## 2014-05-05 NOTE — Consult Note (Signed)
PATIENT NAME:  Pamela BouchardURIBE, Zafira S MR#:  [id number] DATE OF BIRTH:  06-16-1945  DATE OF CONSULTATION:  02/13/2012  CONSULTING PHYSICIAN:  Laurier NancyShaukat A. Carlynn Leduc, MD  INDICATION FOR CONSULTATION: This is a 69 year old female who is very well known to me, came in to the hospital with left-sided weakness and slurred speech, which is gradually getting better but he still has residual weakness in the left extremity as well as in the left leg. She had a positive carotid Doppler with right-sided carotid disease and then underwent CTA of the carotid, which shows significant disease in the right carotid. Dr. Gilda CreaseSchnier would like to operate on this, ideally endarterectomy.   PAST MEDICAL HISTORY: Complicated by the fact that she has a history of coronary artery disease, status post CABG, history of previous CVA, history of hypothyroidism, hypertension, hyperlipidemia and smoking.   FAMILY HISTORY: Positive for coronary artery disease.   SOCIAL HISTORY: She continues to smoke 4 to 6 cigarettes a day.   ALLERGIES: CODEINE.   PHYSICAL EXAMINATION:  GENERAL: She is alert, oriented x 3, in no acute distress.  NECK: No JVD. Right carotid bruit.  LUNGS: Clear.  HEART: Regular rate and rhythm. Normal S1, S2. No audible murmur.  ABDOMEN: Soft, nontender, positive bowel sounds.  EXTREMITIES: No pedal edema.   DIAGNOSTIC DATA: EKG shows normal sinus rhythm, 76 beats per minute. CT of the head showed white matter density consistent with chronic small vessel ischemia, no hemorrhage. CT of the carotid shows right carotid has 75% stenosis with ulcerated plaque.   ASSESSMENT AND PLAN: Right-sided ulcerated plaque in the right carotid with associated cerebrovascular accident. The patient is gradually getting better. Advise continuation of aspirin and Plavix right now. In 2 to 3 weeks, advise doing carotid endarterectomy. Her echocardiogram showed normal left ventricular systolic function with mild mitral regurgitation. She would be  moderate risk for recurrent cerebrovascular accident, but not myocardial infarction. I would proceed with surgery in 2 to 3 weeks.   Thank you very much for the referral.    ____________________________ Laurier NancyShaukat A. Radley Barto, MD sak:jm D: 02/13/2012 17:59:00 ET T: 02/13/2012 18:17:13 ET JOB#: [id number]  cc: Laurier NancyShaukat A. Sigmund Morera, MD, <Dictator> Laurier NancySHAUKAT A Mir Fullilove MD ELECTRONICALLY SIGNED 03/01/2012 9:03

## 2014-05-05 NOTE — H&P (Signed)
DATE OF BIRTH:  1945/06/13  DATE OF ADMISSION:  03/12/2012  PRIMARY CARE PHYSICIAN:  [facility] Hay[facility]J. B. Walker, [facility]   CARDIOLOGIST:  [name]  CHIEF COMPLAINT:  Chest pain today.   HISTORY OF PRESENT ILLNESS:  The patient is a 69 year old Caucasian female with history of coronary artery disease, status post CABG, hypertension, type 2 diabetes, hypothyroidism and hyperlipidemia, who comes to the Emergency Room accompanied by her husband with complaints of chest pain. The patient says she has been having on and off chest pain. However, this afternoon it woke her up from her nap, and she took 2 Nitro-Quick sprays and was brought to the Emergency Room. She is hemodynamically stable, does not have much chest pain. EKG does not show any acute changes, and her first set of cardiac enzymes was negative. The patient is being admitted for unstable angina. She received 4 baby aspirins.   PAST MEDICAL HISTORY: 1.  Coronary artery disease, status post CABG.   2.  Chronic angina. Patient is optimized on medical treatment; follows with [name].  3.  Anxiety.  4.  Type 2 diabetes with peripheral neuropathy and history of falls.  5.  Right and left knee replacements.  6.  Right knee arthroscopy.  7.  Colon resection.  8.  History of cholecystectomy.  9.  Total hysterectomy.  10.  Type 2 diabetes.  11.  Right critical carotid artery stenosis. Patient underwent angiogram per Dr. Gilda CreaseSchnier, and lesion is not amenable for stent; hence, is going to require endarterectomy.  12.  Hyperlipidemia.  13.  GERD.   14.  Hypothyroidism.  15.  History of CVA/stroke.   ALLERGIES:  CODEINE, GEMFIBROZIL, TRICOR, VICODIN.   MEDICATIONS: 1.  Alprazolam 0.5 mg p.o. at bedtime.  2.  Aspirin 81 mg daily.  3.  Amitriptyline 100 mg daily at bedtime.  4.  Plavix 75 mg daily.  5.  Zetia/simvastatin 10/40 p.o. daily.  6.  Lasix 40 mg daily.  7.  Glyset 25 mg b.i.d.  8.  Isosorbide mononitrate 30 mg  extended release p.o. daily.  9.  Levothyroxine 50 mcg p.o. daily.  10.  Metformin 1000 mg p.o. b.i.d.  11.  Metoprolol 25 mg extended release p.o. daily.  12.  Oxycodone 5 mg 1 tablet 3 times a day as needed for pain.  13.  Protonix 40 mg daily.  14.  Paxil 40 mg p.o. daily.  15.  Potassium chloride 20 mEq p.o. daily.  16.  Ranexa 500 mg extended release p.o. b.i.d.  17.  Vitamin D3, 2000 international units daily.  18.  Ambien/zolpidem 10 mg at bedtime.   FAMILY HISTORY: Positive for CAD. Mother died from colon cancer. Father died from complications of MI.    SOCIAL HISTORY:  She smokes about 3 cigarettes per day, has been smoking for the past 50+ years. No alcohol use or drug abuse. Lives with her husband at home.   REVIEW OF SYSTEMS:  CONSTITUTIONAL:  Positive for fatigue, weakness.  EYES:  No blurred or double vision or glaucoma.  EARS, NOSE, THROAT:  No tinnitus, ear pain, hearing loss.  RESPIRATORY:  No cough, wheeze, hemoptysis.  CARDIOVASCULAR:  Positive for chest pain, hypertension.  GASTROINTESTINAL: No nausea, vomiting, diarrhea, abdominal pain, hematemesis or GERD.  GENTIOURINARY:  No dysuria, hematuria.  ENDOCRINE:  No polyuria or nocturia.  HEMATOLOGY:  No anemia or easy bruising.  SKIN:  No acne,  rash.  MUSCULOSKELETAL:  Positive for arthritis and back pain.  NEUROLOGIC: Positive for numbness and weakness. Positive for CVA. Positive neuropathy.  PSYCHIATRIC:  Positive for anxiety. No depression.  All other systems reviewed and negative.   PHYSICAL EXAMINATION: GENERAL:  The patient is awake, alert, oriented x 3, not in acute distress.  VITAL SIGNS:  Afebrile, pulse 76 and regular, blood pressure 149/70, saturation 97% on 2 liters.  HEENT:  Atraumatic, normocephalic. PERRLA. EOM intact. Oral mucosa is moist.  NECK:  Supple. No JVD. No carotid bruit.  RESPIRATORY:  Clear to auscultation bilaterally. No rales, rhonchi, respiratory distress or labored breathing.   CARDIOVASCULAR:  Both heart sounds are normal. Rate, rhythm regular. PMI not lateralized. Chest nontender. Good pedal pulses, good femoral pulses.  No lower extremity edema.  ABDOMEN:  Soft, benign, nontender. No organomegaly. Positive bowel sounds.  NEUROLOGIC:  Exam grossly intact, cranial nerves II through XII. No motor deficits. Patient does have peripheral sensory neuropathy in both lower extremities.  SKIN:  Warm and dry.  PSYCHIATRIC: Patient is awake, alert, oriented x 3.   LABORATORY DATA:  CBC within normal limits. Cardiac enzymes - first set negative. Her glucose is 114, BUN 24, creatinine 1.74, sodium 137, potassium 3.4, chloride 98, bicarb 37, calcium 9.3. Bilirubin is 0.3, LFTs within normal limits.   ASSESSMENT:  The patient is a 69 year old Caucasian female with multiple medical problems, comes to the Emergency Room with increasing chest pain today. The patient does have history of chronic stable angina. She is being admitted with:   1.  Unstable angina, with history of coronary artery disease, status post coronary artery bypass grafting x 2. The patient will be admitted on telemetry floor. Will cycle cardiac enzymes x 3. Continue her cardiac meds. Continue Imdur and Ranexa.  Nitroglycerin p.r.n., and continue aspirin and Plavix. The case was discussed with Dr. Welton Flakes. Per Dr. Welton Flakes, patient has chronic angina and is on optimal medical treatment. The patient currently is chest pain free. Her cardiac enzymes x 1 in the ER is negative. No acute EKG changes noted.   2.  Type 2 diabetes. Continue metformin and Glyset. Will continue sliding scale.   3.  Hypertension, on Toprol and Imdur.   4.  Hyperlipidemia. Continue simvastatin.   5.  Hypothyroidism. The patient is on Synthroid, which we will continue.   6.  Chronic pain syndrome, on oxycodone p.r.n.  7.  Right carotid artery stenosis. The patient needs to get endarterectomy, since the lesion is not amenable to stenting, per Dr.  Gilda Crease. The procedure was done. The patient underwent selective injection of the right common carotid artery and an arch aortogram on February 26.   8.  Deep vein thrombosis prophylaxis. Patient already on aspirin and Plavix. Will keep her on heparin subcu t.i.d.   Further workup according to the patient's clinical course. Hospital admission plan was discussed with patient and her husband, along with Dr. Welton Flakes. Time spent:  50 minutes.   ____________________________ Wylie Hail Allena Katz, MD sap:mr D: 03/12/2012 18:46:47 ET T: 03/12/2012 19:18:54 ET JOB#: [id number]  cc: Acelin Ferdig A. Allena Katz, MD, <Dictator> John B. Danne Harbor, MD Laurier Nancy, MD   Willow Ora MD ELECTRONICALLY SIGNED 03/16/2012 14:52

## 2014-05-06 NOTE — H&P (Signed)
PATIENT NAME:  Pamela Mcmahon, Pamela Mcmahon MR#:  [id number] DATE OF BIRTH:  [birth year]  DATE OF ADMISSION:  04/06/2013  REFERRING PHYSICIAN:  Dr. Lucrezia EuropeAllison Mcmahon.  PRIMARY CARE PHYSICIAN:  Dr. Yates DecampJohn Mcmahon.   CHIEF COMPLAINT:  Altered mental status and slurred speech.   HISTORY OF PRESENT ILLNESS:  This is a 69 year old female with known past medical history of coronary artery disease, anxiety, type 2 diabetes with peripheral neuropathy and history of falls, right carotid stenosis with endarterectomy, the patient presents with multiple complaints, mainly slurred speech, altered mental status and confusion for the last two days, and unsteady gait with fall on Sunday, husband at bedside gives most of the history, reports the patient did not improve since then so he brought her to the ED, in the ED, the patient had CT head which did not show any acute findings, on further interviewing the patient and the husband, reports this patient has been having recurrent symptoms like these for a few months, as well she saw Dr. [name] in the office to rule out stroke as well, but reports her symptoms are becoming more frequent recently and more intense as well, the patient reports unsteady gait, feels generally weak.  Denies any focal deficits, physical exam was not specific, the patient was confused at occasions, but physical exam was inconsistent as well, hospitalist service were requested to admit the patient for further work-up.  Urinalysis was negative.  Her chest x-ray was clean as well, the patient is known to have history of chronic kidney disease, creatinine 1.5 at baseline, it was elevated around 1.8 today.   PAST MEDICAL HISTORY: 1.  Coronary artery disease status post CABG.  2.  Chronic angina, follows with [name].  3.  Anxiety.  4.  Type 2 diabetes with peripheral neuropathy and history of falls.  5.  Left knee replacement.  6.  Right knee replacement.  7.  Colon resection.  8.  History of cholecystectomy.   9.  Hysterectomy.  10.  Right carotid stenosis status post arterectomy.  11.  Hyperlipidemia.  12.  GERD.  13.  Hypothyroidism.  14.  History of CVA and stroke.   SOCIAL HISTORY:  Lives at home with her husband.  She still smokes 1/2 pack per day.  No alcohol.  No illicit drug use.   FAMILY HISTORY:  Significant for coronary artery disease.   ALLERGIES:  1.  CODEINE.  2.  GEMFIBROZIL.  3.  TRICOR. 4.  VICODIN. 5.  TAPE.   HOME MEDICATIONS: 1.  Aspirin 81 mg daily.  2.  Plavix 75 mg daily.  3.  Tramadol 50 1 to 2 tablets 4 times a day as needed.  4.  Imdur 30 mg daily.  5.  Amitriptyline 100 mg daily.  6.  Paroxetine 40 mg daily.  7.  Glyset 25 mg oral 2 times a day.  8.  Simvastatin 40 mg oral daily.  9.  Alprazolam 0.5 mg oral 3 times a day.  10.  Zolpidem 10 mg at bedtime.  12.  Lasix 40 mg oral daily.  13.  Zanaflex 2 mg oral, was started on as recently as her oxycodone was stopped five days ago and this is an as needed medication.  14.  Pantoprazole 40 mg oral daily.  15.  Levothyroxine 50 mcg oral daily.  16.  Vitamin D3 2000 international units oral daily.  17.  Levothyroxine 50 mcg oral daily.    REVIEW OF SYSTEMS:  CONSTITUTIONAL:  The patient denies  any fever, chills, fatigue.  Reports generalized weakness.  Denies weight gain, weight loss.  EYES:  Denies blurry vision, double vision, inflammation, glaucoma.  EARS, NOSE, THROAT:  Denies tinnitus, ear pain, hearing loss, epistaxis or discharge.  RESPIRATORY:  Denies cough, wheezing, hemoptysis, dyspnea.  CARDIOVASCULAR:  Denies chest pain, orthopnea, edema, or palpitations.  GASTROINTESTINAL:  Denies nausea, vomiting, diarrhea, abdominal pain, hematemesis, melena.  GENITOURINARY:  Denies dysuria, hematuria, renal colic.  ENDOCRINE:  Denies polyuria or polydipsia, heat or cold intolerance.  HEMATOLOGY:  Denies anemia, easy bruising, bleeding diathesis.  INTEGUMENT:  Denies acne, rash or skin lesion.   MUSCULOSKELETAL:  Reports history of multiple falls.  Denies any swelling or gout.   NEUROLOGIC:  Reports history of CVA in the past.  Reports confusion, altered mental status, slurred speech and generalized weakness, cannot specify any focal weakness, any tingling, any numbness.  PSYCHIATRIC:  Denies any history of schizophrenia, substance abuse, alcohol abuse.  Reports history of anxiety.   PHYSICAL EXAMINATION: VITAL SIGNS:  Temperature 97.8, pulse 61, respiratory rate 18, blood pressure 123/76, saturating 92% on room air.  GENERAL:  Well-nourished female who looks comfortable in bed in no apparent distress.  HEENT:  Head atraumatic, normocephalic.  Pupils equal, reactive to light.  Pink conjunctivae.  Anicteric sclerae.  Moist oral mucosa.  NECK:  Supple.  No thyromegaly.  No JVD.  CHEST:  Good air entry bilaterally.  No wheezing, rales, rhonchi.  CARDIOVASCULAR:  S1, S2 heard.  No rubs, murmur or gallops.  ABDOMEN:  Soft, nontender, nondistended.  Bowel sounds present.  EXTREMITIES:  No edema.  No clubbing.  No cyanosis.  PSYCHIATRIC:  The patient is confused, awake, alert.  She knows the President.  She knows she is in the hospital.  She knows the date, but she has some inappropriate answers for some simple questions as well.  NEUROLOGIC:  Cranial nerves grossly intact.  Motor exam was inconsistent in general, but bilateral lower extremity 5 out of 5.  Left upper extremity right 5 out of 5, left 4 to 5 out of 5.  No hyperreflexia.  No increased tonicity.  Good reflexes +2 symmetrical.  SKIN:  Warm and dry.  Normal skin turgor.  MUSCULOSKELETAL:  No joint effusion or erythema could be appreciated.  LYMPHATIC:  No cervical lymphadenopathy.   PERTINENT LABORATORY DATA:  Glucose 87, BUN 25, creatinine 1.86, sodium 136, potassium 4.3, chloride 98, CO2 34.  Troponin less than 0.02.  Urinalysis positive for benzodiazepine.  White blood cell 8.1, hemoglobin 13.6, hematocrit 40.5, platelets 180.   INR 1.  Urinalysis negative for leukocyte esterase and nitrite.  PCO2 60.   IMAGING STUDIES:  CT head without contrast showing no acute intracranial process.  Moderate white matter changes suggests chronic small vessel ischemic disease.  Chest x-ray showing no active cardiopulmonary disease.   ASSESSMENT AND PLAN: 1.  Episode of slurred speech, confusion, generalized weakness, neurologic exam is nonspecific, unclear if this is related to transient ischemic attack or not, but the patient known to have these recurrent episodes.  Unclear the etiology.  We will proceed with transient ischemic attack workup.  We will check MRI brain.  We will check 2-D echocardiogram.  We will check carotid Dopplers and we will consult neurology service, as well the patient will receive 324 mg of aspirin in the ED, the patient has elevated CO2, so as well unclear if CO2 not causes contributing to it or not.  We will have her on short trial on  BiPAP.  2.  Acute renal failure, we will continue with intravenous fluids.  We will monitor closely.  3.  History of coronary artery disease.  Denies any chest pain.  Continue with aspirin, Plavix and statin.  4.  Type 2 diabetes.  Hold oral agents.  Keep patient on insulin sliding scale.  5.  Hyperlipidemia.  Continue with statin.  6.  Gastroesophageal reflux disease.  Continue with proton pump inhibitor.  7.  Hypothyroidism.  Continue with Synthroid.  8.  History of cerebrovascular accident.  The patient is on aspirin, Plavix and statin.  9.  Deep vein thrombosis prophylaxis.  SubQ heparin.  10.  CODE STATUS:  THE PATIENT IS A FULL CODE.   Total time spent on admission and patient care 55 minutes.     ____________________________ Starleen Arms, MD dse:ea D: 04/06/2013 06:10:50 ET T: 04/06/2013 06:49:08 ET JOB#: [id number]  cc: Starleen Arms, MD, <Dictator> Katerin Negrete Teena Irani MD ELECTRONICALLY SIGNED 04/07/2013 0:39

## 2014-05-06 NOTE — Op Note (Signed)
PATIENT NAME:  Pamela Mcmahon, Ingra S MR#:  [id number] DATE OF BIRTH:  06/24/1945  DATE OF PROCEDURE:  08/30/2013  REFERRING PHYSICIAN: Jonny RuizJohn B. Danne HarborWalker III, MD.  CONSULTING PAIN PHYSICIAN:  Lendon George A. Laban EmperorNaveira, MD.  LOCATION: Same day surgery operating room #1.   PROCEDURE: L1 Kyphoplasty  INDICATION: Acute L1 Compression Fracture.  NOTE: This is the case of a 69 year old white female patient with a long-standing history of multiple medical problems which now include an acute L1 compression fracture of approximately 20%, including the lower endplate of the vertebral body. The patient also has a long-standing history of bilateral lower extremity diabetic peripheral neuropathy. Her history is further complicated by being anticoagulated with Plavix, due to her coronary artery disease and history of having had a stroke. The patient has significant cognitive impairments and, unfortunately this morning, prior to coming into the hospital, instead of taking her morning pills as they had been labeled by her husband, she took her night pills. This included a cocktail with zolpidem 10 mg p.o., cyclobenzaprine 5 mg, amitriptyline 100 mg, and alprazolam 0.5 mg, as well as her Duragesic 75 mcg patch. Needless to say, the patient comes into the hospital fairly sedated. She walked into the preoperative area and she got into bed and fell asleep. The nurses found her to be rather difficult to wake up and was called in to re-evaluate the patient. The issue seems to have been on the patient's part, as she took her sleeping piils this morning, instaed of her morning pill. This was confirmed by her husband who went home and noticed that she had indeed taken the pills that he had labeled for bedtime.   In any case, due to the fact that we had previously explained the risks and possible complications to the patient and today, we have again done the same including of those of methylmethacrylate  embolism into the lungs and/or heart  with the possibility of death, they  have understood and accepted. Present during this explanation  was the patient's husband, as well as her daughter. We had a very candid long conversation with regards to her medications and how we needed to readjust her goals to being increased level of activity as opposed to 100% relief of the pain. 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   ANESTHESIA: The anesthesia plan was discussed and we came to the conclusion that I would be using primarily local anesthetic for the procedure, the rest would be monitored anesthesia and some sedation as needed.  DESCRIPTION OF THE PROCEDURE: Once we had obtained informed consent and secured an IV, the patient was then taken to OR #1 where she was placed in the prone position over the fluoroscopy compatible table. Two fluoroscopy units were then positioned to obtain  AP and lateral views of the L1 vertebral bodies. The areas were prepped and draped in the usual manner. Using the AP view, the skin over the right pedicle was marked and infiltrated with a local anesthetic. Deeper tissues all the way down to the pedicle were again infiltrated using the local anesthetic and a 22- gauge, 3.5 inch, Quincke needle. A small skin incision was done with an 11 blade. The trocar was then introduced all the way into the top of the right pedicle of L1 at the outer margin of the round, pedicular shadow. The bone was  engaged and the trocar was introduced slowly through the pedicle taking AP and lateral views of the trocar as it was being advanced.   Once the posterior wall of the vertebral body was passed and the AP views demonstrated the torque are to have not challenged medial wall of the right L1 pedicle, the biopsy needle was introduced and a sample taken of the fracture. This was sent to pathology for  analysis. The manual drill bit was then used to advance to the anterior 3rd of the vertebral body, aiming more towards the fracture lower endplate. The drill was then removed and the balloon kyphoplasty system was introduced. The tip of the balloon was observed to have reached the anterior 3rd of  the vertebral body. The balloon was then slowly inflated keeping the pressure below 300 PSI. A total of 3.25 mL was injected in intermittent fashion while taking a look at the AP and lateral views to make sure that the fracture was being reduced without compromising the vertebral body wall. The balloon was then deflated and removed. By this time, the methylmethacrylate mixture had been put together and we had waited over 5 minutes.   Using the 1.5 mL needles, the methylmethacrylate was injected under live lateral fluoroscopy observing for any type of abnormal displacement of the methylmethacrylate. AP and lateral views were taken intermittently to observe the spread of the cement. Once we had completed a total of 3.25 mL of the material methylmethacrylate, the needles were removed and the trocar was left in place for approximately 3 minutes to prevent backtracking of the cement. After this amount of time, the trocar was then slowly moved back within the pedicle and lateral views confirmed that there was no tracking of the methylmethacrylate into the pedicle. Once this had been done and we were happy with this spread, and we had also confirmed no leakage of the methylmethacrylate towards the posterior aspect of the vertebral body or any leaks outside of the vertebral body, then the trocar was fully removed and the skin closed with Dermabond.   INJECTATE: 2% Lidocaine/0.25% Bupivacaine (50:50) & Methylmethacrylate.  COMPLICATIONS: None  EBL: Minimal.  DISPOSITION: The patient was then transferred to the recovery area where upon followup, she indicated 100% relief of her low back pain. Neurological evaluation  demonstrated no deficits. The patient was discharged home under the care of her husband and daughter.  For the procedure, we had given the patient Ancef 1 gram prophylactically and we discharged the patient with a small prescription for Keflex 500 mg to be taken 1 tablet p.o. t.i.d. for 5 days. The family was instructed to call me in the event that there was any kind of problems with the medication or her condition. I will follow up with her in 2 weeks in the pain clinic. If she is still having some back pain and leg pain, I will consider doing an epidural steroid injection under fluoroscopic guidance. The patient had stopped her Plavix for 7 days prior to coming in for the procedure and she was instructed to go back on it tomorrow.    ____________________________ Sydnee Levans Laban Emperor, MD fan:DT D: 08/30/2013 19:22:11 ET T: 08/30/2013 20:39:28 ET JOB#: [id number]  cc: Kaitlyn Franko A. Laban Emperor, MD, <Dictator> Oswaldo Done MD ELECTRONICALLY SIGNED 09/01/2013 12:53

## 2014-05-06 NOTE — Consult Note (Signed)
PATIENT NAME:  Pamela BouchardURIBE, Irianna S MR#:  [id number] DATE OF BIRTH:  [birth year]  DATE OF CONSULTATION:  04/06/2013  REFERRING PHYSICIAN:   CONSULTING PHYSICIAN:  Pauletta BrownsYuriy Kaylanni Ezelle, MD  REASON FOR CONSULTATION:  Unsteady gait, multiple falls, confusion.    HISTORY OF PRESENT ILLNESS:  A 69 year old female with past medical history of coronary artery disease, anxiety, type 2 diabetes, peripheral neuropathy, history of multiple falls, status post carotid endarterectomy on the right side, presenting with slurred speech, altered mental status, confusion lasting 2 days along with unsteady gait since Sunday. Since there was no improvement, the patient's family decided to bring her into the hospital. The patient's workup including CAT scan of the head did not show any acute intracranial abnormalities. The patient  was recently seen by Dr. [name] for stroke workup and no further workup was required. The patient states that she has increased episodes of generalized weakness and unsteady gait. She denies any focal deficits on 1 side of the body compared to the other.   PAST MEDICAL HISTORY: Includes coronary artery disease, status post CABG, chronic angina, history of anxiety, type 2 diabetes, left knee replacement, right knee replacement, colon resection, history of cholecystectomy, hysterectomy, right carotid stenosis, hyperlipidemia, GERD, hypothyroidism, history of questionable stroke in the past.   ALLERGIES: INCLUDE CODEINE, GEMFIBROZIL, TRICOR, VICODIN.   SOCIAL HISTORY: Lives at home with her husband and 1/2 pack a day smoker.   HOME MEDICATIONS: Include aspirin, Plavix, tramadol, amitriptyline, paroxetine, simvastatin, alprazolam 0.5 mg 3 times a day which she takes more and zolpidem 10 mg at night. Lasix, Zanaflex, pantoprazole, levothyroxine, vitamin D3.   REVIEW OF SYSTEMS: Denies any fever, denies any fatigue, denies any shortness of breath or any palpitations, any diarrhea, any constipation, any heat or  cold intolerance. Denies any weakness on 1 side of the body compared to the other.   IMAGING: MRI of the brain did not show any acute intracranial abnormality. Ultrasound of carotids: No hemodynamic stenosis found as well.   LABORATORY DATA:  The patient's laboratory workup was all reviewed.   PHYSICAL EXAMINATION: VITAL SIGNS: Temperature 98.1, pulse 60, respirations 16, blood pressure 113/69, pulse ox 94% on room air.  NEUROLOGICAL EXAMINATION:  The patient is alert, awake, oriented to time, place, location and the reason why she is in the hospital. Speech appears to be fluent. Cranial nerve examination: Extraocular movements are intact. Facial sensation intact. Facial motor intact. Tongue is midline. Uvula elevates symmetrically. Shoulder shrug intact. Motor is 5 out of 5 bilateral upper and lower extremities. Reflexes symmetrical, diminished bilaterally. Sensation intact to light touch and temperature. Coordination: Finger-to-nose intact. Gait not assessed.   ASSESSMENT: A 69 year old female presenting with multiple episodes of falls, what appears to be dysarthria and altered mental status.   PLAN: I think these symptoms are contributed to by polypharmacy. I do not think they are TIA-related even though the patient should continue on her aspirin. I agree with limiting the patient's nighttime Xanax dose which she takes along with zolpidem. No further input at this time.   This case discussed with patient and patient's husband at bedside. I feel safe to be discharged from a neurological standpoint.  ____________________________ Pauletta BrownsYuriy Briyana Badman, MD yz:cs D: 04/06/2013 17:56:33 ET T: 04/06/2013 19:44:23 ET JOB#: [id number]  cc: Pauletta BrownsYuriy Durwin Davisson, MD, <Dictator> Pauletta BrownsYURIY Ura Hausen MD ELECTRONICALLY SIGNED 04/15/2013 11:33

## 2014-05-10 DIAGNOSIS — F411 Generalized anxiety disorder: Secondary | ICD-10-CM | POA: Insufficient documentation

## 2014-06-20 ENCOUNTER — Encounter: Payer: Self-pay | Admitting: Occupational Medicine

## 2014-06-20 ENCOUNTER — Other Ambulatory Visit: Payer: Self-pay

## 2014-06-20 ENCOUNTER — Emergency Department
Admission: EM | Admit: 2014-06-20 | Discharge: 2014-06-21 | Disposition: A | Discharge status: Other Acute Inpatient Hospital | Payer: Medicare Other | Attending: Emergency Medicine | Admitting: Emergency Medicine

## 2014-06-20 DIAGNOSIS — F323 Major depressive disorder, single episode, severe with psychotic features: Secondary | ICD-10-CM

## 2014-06-20 DIAGNOSIS — F329 Major depressive disorder, single episode, unspecified: Secondary | ICD-10-CM | POA: Insufficient documentation

## 2014-06-20 DIAGNOSIS — E162 Hypoglycemia, unspecified: Secondary | ICD-10-CM

## 2014-06-20 DIAGNOSIS — F32A Depression, unspecified: Secondary | ICD-10-CM

## 2014-06-20 DIAGNOSIS — E119 Type 2 diabetes mellitus without complications: Secondary | ICD-10-CM

## 2014-06-20 DIAGNOSIS — F4321 Adjustment disorder with depressed mood: Secondary | ICD-10-CM

## 2014-06-20 DIAGNOSIS — R634 Abnormal weight loss: Secondary | ICD-10-CM

## 2014-06-20 DIAGNOSIS — G4700 Insomnia, unspecified: Secondary | ICD-10-CM | POA: Insufficient documentation

## 2014-06-20 HISTORY — DX: Type 2 diabetes mellitus without complications: E11.9

## 2014-06-20 HISTORY — DX: Pain in unspecified hip: M25.559

## 2014-06-20 HISTORY — DX: Other chronic pain: G89.29

## 2014-06-20 HISTORY — DX: Cerebral infarction, unspecified: I63.9

## 2014-06-20 HISTORY — DX: Fibromyalgia: M79.7

## 2014-06-20 HISTORY — DX: Polyneuropathy, unspecified: G62.9

## 2014-06-20 LAB — CBC
HCT: 42.3 % (ref 35.0–47.0)
HEMOGLOBIN: 13.8 g/dL (ref 12.0–16.0)
MCH: 28.5 pg (ref 26.0–34.0)
MCHC: 32.6 g/dL (ref 32.0–36.0)
MCV: 87.3 fL (ref 80.0–100.0)
Platelets: 135 10*3/uL — ABNORMAL LOW (ref 150–440)
RBC: 4.85 MIL/uL (ref 3.80–5.20)
RDW: 15.8 % — AB (ref 11.5–14.5)
WBC: 7.7 10*3/uL (ref 3.6–11.0)

## 2014-06-20 LAB — COMPREHENSIVE METABOLIC PANEL
ALBUMIN: 3.9 g/dL (ref 3.5–5.0)
ALT: 11 U/L — AB (ref 14–54)
ANION GAP: 11 (ref 5–15)
AST: 24 U/L (ref 15–41)
Alkaline Phosphatase: 85 U/L (ref 38–126)
BILIRUBIN TOTAL: 0.3 mg/dL (ref 0.3–1.2)
BUN: 18 mg/dL (ref 6–20)
CALCIUM: 9.4 mg/dL (ref 8.9–10.3)
CHLORIDE: 97 mmol/L — AB (ref 101–111)
CO2: 30 mmol/L (ref 22–32)
Creatinine, Ser: 1.64 mg/dL — ABNORMAL HIGH (ref 0.44–1.00)
GFR calc Af Amer: 36 mL/min — ABNORMAL LOW (ref >60)
GFR calc non Af Amer: 31 mL/min — ABNORMAL LOW (ref >60)
GLUCOSE: 229 mg/dL — AB (ref 65–99)
Potassium: 3.5 mmol/L (ref 3.5–5.1)
SODIUM: 138 mmol/L (ref 135–145)
TOTAL PROTEIN: 6.9 g/dL (ref 6.5–8.1)

## 2014-06-20 LAB — URINALYSIS COMPLETE WITH MICROSCOPIC (ARMC ONLY)
Bacteria, UA: NONE SEEN
Bilirubin Urine: NEGATIVE
GLUCOSE, UA: 50 mg/dL — AB
Hgb urine dipstick: NEGATIVE
Ketones, ur: NEGATIVE mg/dL
Nitrite: NEGATIVE
PH: 8 (ref 5.0–8.0)
PROTEIN: NEGATIVE mg/dL
SPECIFIC GRAVITY, URINE: 1.003 — AB (ref 1.005–1.030)

## 2014-06-20 LAB — HEMOGLOBIN A1C: Hgb A1c MFr Bld: 6.7 % — ABNORMAL HIGH (ref 4.0–6.0)

## 2014-06-20 LAB — GLUCOSE, CAPILLARY
GLUCOSE-CAPILLARY: 146 mg/dL — AB (ref 65–99)
GLUCOSE-CAPILLARY: 135 mg/dL — AB (ref 65–99)
Glucose-Capillary: 230 mg/dL — ABNORMAL HIGH (ref 65–99)
Glucose-Capillary: 89 mg/dL (ref 65–99)
Glucose-Capillary: 113 mg/dL — ABNORMAL HIGH (ref 65–99)

## 2014-06-20 MED ORDER — FUROSEMIDE 40 MG PO TABS
40.0000 mg | ORAL_TABLET | Freq: Every day | ORAL | Status: DC
  Administered 2014-06-20: 40 mg via ORAL

## 2014-06-20 MED ORDER — DEXTROSE-NACL 5-0.45 % IV SOLN
Freq: Once | INTRAVENOUS | Status: AC
  Administered 2014-06-20: 07:00:00 via INTRAVENOUS

## 2014-06-20 MED ORDER — LEVOTHYROXINE SODIUM 50 MCG PO TABS
50.0000 ug | ORAL_TABLET | Freq: Every day | ORAL | Status: DC
  Filled 2014-06-20 (×2): qty 1

## 2014-06-20 MED ORDER — PANTOPRAZOLE SODIUM 40 MG PO TBEC
40.0000 mg | DELAYED_RELEASE_TABLET | Freq: Every day | ORAL | Status: DC
  Administered 2014-06-20: 40 mg via ORAL

## 2014-06-20 MED ORDER — ASPIRIN 81 MG PO CHEW
CHEWABLE_TABLET | ORAL | Status: AC
Start: 2014-06-20 — End: 2014-06-20
  Administered 2014-06-20: 81 mg via ORAL
  Filled 2014-06-20: qty 1

## 2014-06-20 MED ORDER — SIMVASTATIN 20 MG PO TABS: 20.0000 mg | ORAL_TABLET | Freq: Every day | ORAL | Status: DC

## 2014-06-20 MED ORDER — ACARBOSE 25 MG PO TABS
25.0000 mg | ORAL_TABLET | Freq: Three times a day (TID) | ORAL | Status: DC
  Filled 2014-06-20 (×4): qty 1

## 2014-06-20 MED ORDER — ALPRAZOLAM 0.5 MG PO TABS
ORAL_TABLET | ORAL | Status: AC
  Administered 2014-06-20: 0.5 mg via ORAL
  Filled 2014-06-20: qty 1

## 2014-06-20 MED ORDER — PANTOPRAZOLE SODIUM 40 MG PO TBEC
DELAYED_RELEASE_TABLET | ORAL | Status: AC
  Administered 2014-06-20: 40 mg via ORAL
  Filled 2014-06-20: qty 1

## 2014-06-20 MED ORDER — ACARBOSE 25 MG PO TABS: 25.0000 mg | ORAL_TABLET | Freq: Three times a day (TID) | ORAL | Status: DC

## 2014-06-20 MED ORDER — CLOPIDOGREL BISULFATE 75 MG PO TABS
ORAL_TABLET | ORAL | Status: AC
  Administered 2014-06-20: 75 mg via ORAL
  Filled 2014-06-20: qty 1

## 2014-06-20 MED ORDER — CLOPIDOGREL BISULFATE 75 MG PO TABS
75.0000 mg | ORAL_TABLET | Freq: Every day | ORAL | Status: DC
  Administered 2014-06-20: 75 mg via ORAL

## 2014-06-20 MED ORDER — MIRTAZAPINE 15 MG PO TABS
ORAL_TABLET | ORAL | Status: AC
  Administered 2014-06-20: 15 mg via ORAL
  Filled 2014-06-20: qty 1

## 2014-06-20 MED ORDER — MIRTAZAPINE 15 MG PO TABS
15.0000 mg | ORAL_TABLET | Freq: Every day | ORAL | Status: DC
  Administered 2014-06-20: 15 mg via ORAL
  Filled 2014-06-20: qty 1

## 2014-06-20 MED ORDER — ASPIRIN 81 MG PO CHEW
81.0000 mg | CHEWABLE_TABLET | Freq: Every day | ORAL | Status: DC
  Administered 2014-06-20: 81 mg via ORAL

## 2014-06-20 MED ORDER — ALPRAZOLAM 0.5 MG PO TABS
0.5000 mg | ORAL_TABLET | Freq: Once | ORAL | Status: AC
  Administered 2014-06-20: 0.5 mg via ORAL

## 2014-06-20 MED ORDER — FUROSEMIDE 40 MG PO TABS
ORAL_TABLET | ORAL | Status: AC
  Administered 2014-06-20: 40 mg via ORAL
  Filled 2014-06-20: qty 1

## 2014-06-20 MED ORDER — AMITRIPTYLINE HCL 100 MG PO TABS
100.0000 mg | ORAL_TABLET | Freq: Every day | ORAL | Status: DC
  Administered 2014-06-20: 100 mg via ORAL
  Filled 2014-06-20 (×2): qty 1

## 2014-06-20 MED ORDER — ALPRAZOLAM 0.5 MG PO TABS
ORAL_TABLET | ORAL | Status: AC
  Filled 2014-06-20: qty 1

## 2014-06-20 NOTE — ED Provider Notes (Signed)
I received the patient in signout. The patient was initially seen for suicidal thoughts but found to have hypoglycemia. Medical services recommended a decrease in her oral glycemic meds. "Reduce dose of Glyset from 50 mg 3 times a day to 25 mg 3 times a day with meals. Check blood sugars before meals and at bedtime." The patient understands this recommendation when I reviewed it with her.  Of note, the patient is asking to be discharged. I spoke with Dr. Langston MaskerShaevitz about this, and he advised that he would not send her home and would instead place her under IVC to have psychiatry see her. I spoke to the patient about this and she is calm, and she is willing to wait for psychiatric evaluation. His of her threats to leave and because Dr. Langston MaskerShaevitz recommended, I have placed her under IVC.  She is acting calm and appropriate at this time. In no distress. Ambulating well.  ----------------------------------------- 9:58 PM on 06/20/2014 -----------------------------------------  Patient has been seen by Dr. Toni Amendlapacs who will admit to the behavioral medicine unit.  Impression Major depression   admitted to behavioral medicine  Sharyn CreamerMark Chrishun Scheer, MD 06/20/14 [id number]

## 2014-06-20 NOTE — Progress Notes (Addendum)
Pt seen and examined. Hypoglycemia at home down to 36 today AM. In ED 230 on D5. Skipped dinner and breakfast today. Has had low blood sugars consistently as low as 50s at home for past few days. In Grief from Husband's suicide in February 2016. Poor appetite. Continues to take all diabetes meds.  Will stop D5. Get breakfast and juice.  If blood sugars are in normal range can d/c home or will need to admit for D5 support atleast overnight.  Discussed with ED physician Dr. Raynelle CharySchavitz.  Will follow.   10 AM  Blood sugars stable. Encourage patient to not skip meals.  Decrease Glyset to 25 mg TID before meals. Skip Glyset if not eating. Also will need psychiatry follow up.

## 2014-06-20 NOTE — ED Notes (Signed)
Patient brought here from room 9 due to need for medical rooms. Patient awaiting psych consult. Patient is agitated about being in this room but calmed after getting a table and calling her son.

## 2014-06-20 NOTE — ED Notes (Signed)

## 2014-06-20 NOTE — ED Notes (Signed)
Patient came to door and stated she is ready to go home; states she told Dr. Pershing ProudSchaevitz she was ready to go and he said OK.  Informed patient I would discharge her when MD had discharge papers written.

## 2014-06-20 NOTE — ED Provider Notes (Addendum)
  Physical Exam  BP 157/80 mmHg  Pulse 73  Temp(Src) 97.9 F (36.6 C)  Resp 18  Ht 4\' 11"  (1.499 m)  Wt 125 lb (56.7 kg)  BMI 25.23 kg/m2  SpO2 95%  Patient presents with 2 episodes of hypoglycemia on blood dried as well as another long-acting diabetic agent. Awaiting labs. We'll admit for continued hypoglycemia on glyburide. On dextrose-containing IV fluids at this time. Also with depression. Will require psychiatric consult. Physical Exam Resting comfortably. ED Course  Procedures  MDM Patient with 2 episodes of hypoglycemia on glyburide. We'll admit to the hospital. Signed out to Dr. Nemiah CommanderKalisetti.      Myrna Blazeravid Matthew Schaevitz, MD 06/20/14 [phone]  Discussed with Dr. Elpidio AnisSudini thinks patient does not need admission. Would prefer if patient eat and then recheck glucose. Patient admits that she has been taking her meds but skipping meals. Also with 20 pound weight loss. Patient maintained her glucose here after eating food.  Likely hypoglycemia secondary to decreased by mouth intake and possibly increased insulin sensitivity.  Patient also becomes very tearful when she talks about her husband and taking care of her son. She denies any suicidal or homicidal ideations. However, she told the behavioral health intake case manager that she would like to see her husband again. Behavioral health intake is recommending that she is seen by the psychiatrist prior to leaving. The patient at this point would like to stay. However, I did discuss this case with Dr. Fanny BienQuale who will be managing the patient from here forth. I said that the patient should not be able to leave without getting the psychiatrist first because of her red flag symptoms of wine to see her husband and her severe depression. Per Dr. Elpidio AnisSudini, her Glyset will be reduced to 25 mg before every meal. She should not take this medicine if she is not eating which I discussed with the patient.  Myrna Blazeravid Matthew Schaevitz, MD 06/20/14 [phone]

## 2014-06-20 NOTE — ED Notes (Signed)
BEHAVIORAL HEALTH ROUNDING Patient sleeping: no  Patient alert and oriented: yes Behavior appropriate: Yes.  ; If no, describe:  Nutrition and fluids offered: Yes  Toileting and hygiene offered: Yes  Sitter present: yes Law enforcement present: Yes  

## 2014-06-20 NOTE — Consult Note (Signed)
[facility] -  at [facility]   PATIENT NAME: Pamela Mcmahon    MR#:  [id number]  DATE OF BIRTH:  1945/08/25  DATE OF ADMISSION:  06/20/2014  PRIMARY CARE PHYSICIAN: No primary care provider on file.   CONSULT REQUESTING/REFERRING PHYSICIAN: Dr. Arther Abbott)  REASON FOR CONSULT - HYPOGLYCEMIA  CHIEF COMPLAINT:   Chief Complaint  Patient presents with  . Hypoglycemia    HISTORY OF PRESENT ILLNESS:  Pamela Mcmahon  is a 69 y.o. female with a known history of DM, HTN, CVA, Depression here with hypoglycemia. BS 36 on initial contact by EMS. Patient mentions that she skipped her dinner yesterday and also a breakfast today but has been taking all her medications regularly. Patient's husband committed suicide in February 2016. She has been grieving since. Poor appetite with signs of depression. No suicidal ideation or attempts. Patient has been placed on a D5 at 120 mL per hour and hospitalist team has been consulted. Presently patient is alert and oriented. Has a flat affect. Does complain of poor appetite. She mentions that she has had multiple episodes of low blood sugars with the lowest she noticed was 57 due to poor appetite. No recent change in medications. She takes Glyset 50 mg 3 times a day before meals.  PAST MEDICAL HISTORY:   Past Medical History  Diagnosis Date  . Diabetes mellitus without complication   . Stroke   . Neuropathy   . Fibromyalgia   . Chronic hip pain     left    PAST SURGICAL HISTORY:   Past Surgical History  Procedure Laterality Date  . Cholecystectomy    . Abdominal hysterectomy    . Cardiac surgery      stents    SOCIAL HISTORY:   History  Substance Use Topics  . Smoking status: Current Some Day Smoker    Types: Cigarettes  . Smokeless tobacco: Not on file  . Alcohol Use: No    FAMILY HISTORY:  History reviewed. No pertinent family history.  DRUG ALLERGIES:   Allergies  Allergen Reactions  . Codeine Hives   . Diazepam Other (See Comments)    Reaction: Visual disturbance  . Gemfibrozil Other (See Comments)    Reaction: Unknown  . Lyrica [Pregabalin] Other (See Comments)    Reaction: Loss of balance, Pt states "it doesn't work."  . Tricor [Fenofibrate] Other (See Comments)    Reaction: Unknown  . Vicodin [Hydrocodone-Acetaminophen] Hives    REVIEW OF SYSTEMS:   Review of Systems  Constitutional: Positive for malaise/fatigue. Negative for fever, chills and weight loss.  HENT: Negative for hearing loss and nosebleeds.   Eyes: Negative for blurred vision, double vision and pain.  Respiratory: Negative for cough, hemoptysis, sputum production, shortness of breath and wheezing.   Cardiovascular: Negative for chest pain, palpitations, orthopnea and leg swelling.  Gastrointestinal: Negative for nausea, vomiting, abdominal pain, diarrhea and constipation.  Genitourinary: Negative for dysuria and hematuria.  Musculoskeletal: Positive for back pain and joint pain. Negative for myalgias and falls.  Skin: Negative for rash.  Neurological: Positive for sensory change (neuropathy) and weakness. Negative for dizziness, tremors, speech change, focal weakness, seizures and headaches.  Endo/Heme/Allergies: Does not bruise/bleed easily.  Psychiatric/Behavioral: Positive for depression. Negative for memory loss. The patient has insomnia. The patient is not nervous/anxious.     MEDICATIONS AT HOME:   Prior to Admission medications   Medication Sig Start Date End Date Taking? Authorizing Provider  ALPRAZolam Prudy Feeler) 0.5 MG tablet Take 0.5  mg by mouth 3 (three) times daily as needed for anxiety.   Yes Historical Provider, MD  amitriptyline (ELAVIL) 100 MG tablet Take 100 mg by mouth at bedtime.   Yes Historical Provider, MD  aspirin EC 81 MG tablet Take 81 mg by mouth daily.   Yes Historical Provider, MD  clopidogrel (PLAVIX) 75 MG tablet Take 75 mg by mouth daily.   Yes Historical Provider, MD  fentaNYL  (DURAGESIC - DOSED MCG/HR) 75 MCG/HR Place 75 mcg onto the skin every 3 (three) days.   Yes Historical Provider, MD  furosemide (LASIX) 40 MG tablet Take 40 mg by mouth daily.   Yes Historical Provider, MD  levothyroxine (SYNTHROID, LEVOTHROID) 50 MCG tablet Take 50 mcg by mouth daily before breakfast.   Yes Historical Provider, MD  miglitol (GLYSET) 50 MG tablet Take 50 mg by mouth 3 (three) times daily with meals.   Yes Historical Provider, MD  nitroGLYCERIN (NITROLINGUAL) 0.4 MG/SPRAY spray Place 1 spray under the tongue every 5 (five) minutes x 3 doses as needed for chest pain.   Yes Historical Provider, MD  omeprazole (PRILOSEC) 40 MG capsule Take 40 mg by mouth daily.   Yes Historical Provider, MD  potassium chloride SA (K-DUR,KLOR-CON) 20 MEQ tablet Take 10 mEq by mouth 2 (two) times daily. Take 1/2 tablet in the morning and 1/2 tablet in the evening.   Yes Historical Provider, MD  simvastatin (ZOCOR) 40 MG tablet Take 40 mg by mouth at bedtime.   Yes Historical Provider, MD  zolpidem (AMBIEN) 10 MG tablet Take 10 mg by mouth at bedtime.   Yes Historical Provider, MD      VITAL SIGNS:  Blood pressure 115/86, pulse 86, temperature 97.9 F (36.6 C), resp. rate 18, height  (1.499 m), weight 56.7 kg (125 lb), SpO2 95 %.  PHYSICAL EXAMINATION:  Physical Exam  GENERAL:  69 y.o.-year-old patient lying in the bed with no acute distress.  EYES: Pupils equal, round, reactive to light and accommodation. No scleral icterus. Extraocular muscles intact.  HEENT: Head atraumatic, normocephalic. Oropharynx and nasopharynx clear. No oropharyngeal erythema, moist oral mucosa  NECK:  Supple, no jugular venous distention. No thyroid enlargement, no tenderness.  LUNGS: Normal breath sounds bilaterally, no wheezing, rales, rhonchi. No use of accessory muscles of respiration.  CARDIOVASCULAR: S1, S2 normal. No murmurs, rubs, or gallops.  ABDOMEN: Soft, nontender, nondistended. Bowel sounds present. No  organomegaly or mass.  EXTREMITIES: No pedal edema, cyanosis, or clubbing. + 2 pedal & radial pulses b/l.   NEUROLOGIC: Cranial nerves II through XII are intact. No focal Motor or sensory deficits appreciated b/l PSYCHIATRIC: The patient is alert and oriented x 3. Good affect.  SKIN: No obvious rash, lesion, or ulcer.   LABORATORY PANEL:   CBC  Recent Labs Lab 06/20/14 0650  WBC 7.7  HGB 13.8  HCT 42.3  PLT 135*   ------------------------------------------------------------------------------------------------------------------  Chemistries   Recent Labs Lab 06/20/14 0650  NA 138  K 3.5  CL 97*  CO2 30  GLUCOSE 229*  BUN 18  CREATININE 1.64*  CALCIUM 9.4  AST 24  ALT 11*  ALKPHOS 85  BILITOT 0.3   ------------------------------------------------------------------------------------------------------------------  Cardiac Enzymes No results for input(s): TROPONINI in the last 168 hours. ------------------------------------------------------------------------------------------------------------------  RADIOLOGY:  No results found.   IMPRESSION AND PLAN:   * Hypoglycemia. This is due to poor oral intake. Counseled patient to eat regular meals. Also not take her diabetes medications if she skips her meals. I  have stopped her D5 drip. Patient had a breakfast sandwich along with some juice and blood sugars are stable in the range of 145 and 130 on 2 repeated blood sugar check. Discussed care with Dr. Archer AsaShavitz of emergency room. Reduce dose of Glyset from 50 mg 3 times a day to 25 mg 3 times a day with meals. Check blood sugars before meals and at bedtime.  * Depression. Will need psychiatry follow-up. Her loss of appetite is secondary to depression.  Patient is being discharged home by the ER physician. Follow-up with primary care physician and psychiatry in a week.  All the records are reviewed and case discussed with ED provider. Management plans discussed with the  patient, family and they are in agreement.   TOTAL TIME TAKING CARE OF THIS PATIENT: 45 minutes.    Milagros LollSudini, Terron Merfeld R M.D on 06/20/2014 at 1:50 PM  Between 7am to 6pm - Pager - [phone]  After 6pm go to [email or web address] - password [email or web address] [facility]  [facility]  Office  [phone]  CC: Primary care physician; No primary care provider on file.

## 2014-06-20 NOTE — ED Notes (Signed)
Pt presents via EMS from home lives with son pt has taking her AM meds for DM without eating BS 57 she ate debbie cake and oj she then called EMS for pysch issues  Husband died in feb committed suicide. Every since then she hasnt been taking care of herself not eating drinking c/o hip BS 38 with EMS pt walked outside to meet EMS pt is alert but forgetful of phone number. Pt having trouble sleeping Denies SI/HI.

## 2014-06-20 NOTE — Consult Note (Signed)
[facility] Consult   Reason for Consult:  Consult for this 69 year old woman with multiple symptoms of depression and poor self-care Referring Physician:  quale Patient Identification: Pamela Mcmahon MRN:  [id number] Principal Diagnosis: Major psychotic depression, single episode Diagnosis:   Patient Active Problem List   Diagnosis Date Noted  . 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 Diabetes [E11.9] 06/20/2014  . Weight loss [R63.4] 06/20/2014  . Grief [F43.21] 06/20/2014    Total Time spent with patient: 1 hour  Subjective:   Pamela Mcmahon is a 69 y.o. female patient admitted with "I just couldn't keep my sugar up". Patient has not been eating well. Not taking care of herself. Extremely depressed.Marland Kitchen  HPI:  Information from the patient and the chart. Patient came in to the emergency room today because she had been unable to get her blood sugar,. All night long last night she was waking up with blood sugars in the 30s. When she came into the hospital today and was put on IV fluid and reset her sugar came up and has been stable. It appears that she has not been eating well. She says she's lost about 20 pounds in the last couple months. Mood is sad and depressed most the time. Minimal enjoyment in her life. Frequent crying spells. Sleeps poorly at night despite her medication. Major stress is that her husband committed suicide in February. Evidently he shot himself in the home while she and her adult son were both present. Patient has not gone to see a counselor or therapist. She says that she has been taking Prozac although it's not on her medicine list that I can see right now. She impressed all the doctors and nurses who saw her earlier this evening at how depressed she seemed to be. Although she is minimizing it now it seems clear that she's not been taking care of herself very well.  Past psychiatric history: She's been on the Prozac for a couple of  years indicate moving that she must have been depressed even prior to the death of her husband. Never been in a psychiatric hospital no suicide attempts no history of psychosis.  Medical history: Patienthas diabetes and chronic pain. Hypothyroidism. Low blood sugar when she came in today which seems to of gotten better. She has a history of a stroke a year or 2 ago.  Social history: Lives with her adult son. She describes him as having a learning disability. It sounds like he might have mild mental retardation. Husband committed suicide a few months ago.  Family history: Positive for depression  Substance abuse history: Patient says she does not drink denies any drug abuse problems.   HPI Elements:   Quality:  Depressed mood and poor self-care. Severity:  Severe. Timing:  Worse over the last few days to month. Duration:  Present at least since February possibly getting worse. Context:  Death of her husband. Loneliness..  Past Medical History:  Past Medical History  Diagnosis Date  . Diabetes mellitus without complication   . Stroke   . Neuropathy   . Fibromyalgia   . Chronic hip pain     left    Past Surgical History  Procedure Laterality Date  . Cholecystectomy    . Abdominal hysterectomy    . Cardiac surgery      stents   Family History: History reviewed. No pertinent family history. Social History:  History  Alcohol Use No     History  Drug Use No    History   Social History  . Marital Status: Married    Spouse Name: N/A  . Number of Children: N/A  . Years of Education: N/A   Social History Main Topics  . Smoking status: Current Some Day Smoker    Types: Cigarettes  . Smokeless tobacco: Not on file  . Alcohol Use: No  . Drug Use: No  . Sexual Activity: No   Other Topics Concern  . None   Social History Narrative  . None   Additional Social History:                          Allergies:   Allergies  Allergen Reactions  . Codeine Hives   . Diazepam Other (See Comments)    Reaction: Visual disturbance  . Gemfibrozil Other (See Comments)    Reaction: Unknown  . Lyrica [Pregabalin] Other (See Comments)    Reaction: Loss of balance, Pt states "it doesn't work."  . Tricor [Fenofibrate] Other (See Comments)    Reaction: Unknown  . Vicodin [Hydrocodone-Acetaminophen] Hives    Labs:  Results for orders placed or performed during the hospital encounter of 06/20/14 (from the past 48 hour(s))  CBC     Status: Abnormal   Collection Time: 06/20/14  6:50 AM  Result Value Ref Range   WBC 7.7 3.6 - 11.0 K/uL   RBC 4.85 3.80 - 5.20 MIL/uL   Hemoglobin 13.8 12.0 - 16.0 g/dL   HCT 42.3 35.0 - 47.0 %   MCV 87.3 80.0 - 100.0 fL   MCH 28.5 26.0 - 34.0 pg   MCHC 32.6 32.0 - 36.0 g/dL   RDW 15.8 (H) 11.5 - 14.5 %   Platelets 135 (L) 150 - 440 K/uL  Comprehensive metabolic panel     Status: Abnormal   Collection Time: 06/20/14  6:50 AM  Result Value Ref Range   Sodium 138 135 - 145 mmol/L   Potassium 3.5 3.5 - 5.1 mmol/L   Chloride 97 (L) 101 - 111 mmol/L   CO2 30 22 - 32 mmol/L   Glucose, Bld 229 (H) 65 - 99 mg/dL   BUN 18 6 - 20 mg/dL   Creatinine, Ser 1.64 (H) 0.44 - 1.00 mg/dL   Calcium 9.4 8.9 - 10.3 mg/dL   Total Protein 6.9 6.5 - 8.1 g/dL   Albumin 3.9 3.5 - 5.0 g/dL   AST 24 15 - 41 U/L   ALT 11 (L) 14 - 54 U/L   Alkaline Phosphatase 85 38 - 126 U/L   Total Bilirubin 0.3 0.3 - 1.2 mg/dL   GFR calc non Af Amer 31 (L) >60 mL/min   GFR calc Af Amer 36 (L) >60 mL/min    Comment: (NOTE) The eGFR has been calculated using the CKD EPI equation. This calculation has not been validated in all clinical situations. eGFR's persistently <60 mL/min signify possible Chronic Kidney Disease.    Anion gap 11 5 - 15  Hemoglobin A1c     Status: Abnormal   Collection Time: 06/20/14  6:50 AM  Result Value Ref Range   Hgb A1c MFr Bld 6.7 (H) 4.0 - 6.0 %  Glucose, capillary     Status: Abnormal   Collection Time: 06/20/14  6:54 AM   Result Value Ref Range   Glucose-Capillary 230 (H) 65 - 99 mg/dL  Urinalysis complete, with microscopic ([facility] only)     Status: Abnormal   Collection Time:  06/20/14  7:53 AM  Result Value Ref Range   Color, Urine STRAW (A) YELLOW   APPearance CLEAR (A) CLEAR   Glucose, UA 50 (A) NEGATIVE mg/dL   Bilirubin Urine NEGATIVE NEGATIVE   Ketones, ur NEGATIVE NEGATIVE mg/dL   Specific Gravity, Urine 1.003 (L) 1.005 - 1.030   Hgb urine dipstick NEGATIVE NEGATIVE   pH 8.0 5.0 - 8.0   Protein, ur NEGATIVE NEGATIVE mg/dL   Nitrite NEGATIVE NEGATIVE   Leukocytes, UA TRACE (A) NEGATIVE   RBC / HPF 0-5 0 - 5 RBC/hpf   WBC, UA 6-30 0 - 5 WBC/hpf   Bacteria, UA NONE SEEN NONE SEEN   Squamous Epithelial / LPF 0-5 (A) NONE SEEN  Glucose, capillary     Status: Abnormal   Collection Time: 06/20/14  9:33 AM  Result Value Ref Range   Glucose-Capillary 146 (H) 65 - 99 mg/dL   Comment 1 Document in Chart   Glucose, capillary     Status: Abnormal   Collection Time: 06/20/14 12:02 PM  Result Value Ref Range   Glucose-Capillary 135 (H) 65 - 99 mg/dL  Glucose, capillary     Status: None   Collection Time: 06/20/14  4:15 PM  Result Value Ref Range   Glucose-Capillary 89 65 - 99 mg/dL  Glucose, capillary     Status: Abnormal   Collection Time: 06/20/14  8:20 PM  Result Value Ref Range   Glucose-Capillary 113 (H) 65 - 99 mg/dL   Comment 1 Notify RN     Vitals: Blood pressure 120/88, pulse 78, temperature 97.3 F (36.3 C), resp. rate 18, height $RemoveBe'4\' 11"'kISioNIqX$  (1.499 m), weight 56.7 kg (125 lb), SpO2 100 %.  Risk to Self: Suicidal Ideation: Yes-Currently Present Suicidal Intent: No Is patient at risk for suicide?: Yes ("I wanted to be with my husband.") Suicidal Plan?: No Access to Means: Yes (pills) Specify Access to Suicidal Means: pills What has been your use of drugs/alcohol within the last 12 months?: none How many times?: 0 Other Self Harm Risks: 0 Triggers for Past Attempts: None  known Intentional Self Injurious Behavior: None Risk to Others: Homicidal Ideation: No Thoughts of Harm to Others: No Current Homicidal Intent: No Current Homicidal Plan: No Access to Homicidal Means: No Identified Victim: none History of harm to others?: No Assessment of Violence: On admission Violent Behavior Description: none Does patient have access to weapons?: No Criminal Charges Pending?: No Does patient have a court date: No Prior Inpatient Therapy: Prior Inpatient Therapy: No Prior Outpatient Therapy: Prior Outpatient Therapy: No Does patient have an ACCT team?: No Does patient have Intensive In-House Services?  : No Does patient have [facility] services? : No Does patient have [facility] services?: No  No current facility-administered medications for this encounter.   Current Outpatient Prescriptions  Medication Sig Dispense Refill  . ALPRAZolam (XANAX) 0.5 MG tablet Take 0.5 mg by mouth 3 (three) times daily as needed for anxiety.    Marland Kitchen amitriptyline (ELAVIL) 100 MG tablet Take 100 mg by mouth at bedtime.    Marland Kitchen aspirin EC 81 MG tablet Take 81 mg by mouth daily.    . clopidogrel (PLAVIX) 75 MG tablet Take 75 mg by mouth daily.    . fentaNYL (DURAGESIC - DOSED MCG/HR) 75 MCG/HR Place 75 mcg onto the skin every 3 (three) days.    . furosemide (LASIX) 40 MG tablet Take 40 mg by mouth daily.    Marland Kitchen levothyroxine (SYNTHROID, LEVOTHROID) 50 MCG tablet Take 50 mcg  by mouth daily before breakfast.    . miglitol (GLYSET) 50 MG tablet Take 50 mg by mouth 3 (three) times daily with meals.    . nitroGLYCERIN (NITROLINGUAL) 0.4 MG/SPRAY spray Place 1 spray under the tongue every 5 (five) minutes x 3 doses as needed for chest pain.    Marland Kitchen omeprazole (PRILOSEC) 40 MG capsule Take 40 mg by mouth daily.    . potassium chloride SA (K-DUR,KLOR-CON) 20 MEQ tablet Take 10 mEq by mouth 2 (two) times daily. Take 1/2 tablet in the morning and 1/2 tablet in the evening.    . simvastatin (ZOCOR) 40 MG tablet  Take 40 mg by mouth at bedtime.    Marland Kitchen zolpidem (AMBIEN) 10 MG tablet Take 10 mg by mouth at bedtime.    Marland Kitchen acarbose (PRECOSE) 25 MG tablet Take 1 tablet (25 mg total) by mouth 3 (three) times daily with meals. Do not take if you are not eating a meal. 60 tablet 0    Musculoskeletal: Strength & Muscle Tone: within normal limits Gait & Station: normal Patient leans: N/A  Psychiatric Specialty Exam: Physical Exam  Constitutional: She appears well-developed and well-nourished.  HENT:  Head: Normocephalic and atraumatic.  Eyes: Conjunctivae are normal. Pupils are equal, round, and reactive to light.  Neck: Normal range of motion.  Cardiovascular: Normal heart sounds.   Respiratory: Effort normal.  GI: Soft.  Musculoskeletal: Normal range of motion.  Neurological: She is alert.  Skin: Skin is warm and dry.  Psychiatric: Judgment and thought content normal. Her speech is delayed. She is slowed. Cognition and memory are normal. She exhibits a depressed mood.    Review of Systems  Constitutional: Negative.   HENT: Negative.   Eyes: Negative.   Respiratory: Negative.   Cardiovascular: Negative.   Gastrointestinal: Negative.   Musculoskeletal: Negative.   Skin: Negative.   Neurological: Negative.   Psychiatric/Behavioral: Positive for depression. Negative for suicidal ideas, hallucinations and substance abuse. The patient has insomnia.     Blood pressure 120/88, pulse 78, temperature 97.3 F (36.3 C), resp. rate 18, height $RemoveBe'4\' 11"'OneeSLFwV$  (1.499 m), weight 56.7 kg (125 lb), SpO2 100 %.Body mass index is 25.23 kg/(m^2).  General Appearance: Casual  Eye Contact::  Good  Speech:  Normal Rate  Volume:  Normal  Mood:  Anxious and Depressed  Affect:  Tearful  Thought Process:  Logical  Orientation:  Full (Time, Place, and Person)  Thought Content:  Negative  Suicidal Thoughts:  No  Homicidal Thoughts:  No  Memory:  Immediate;   Good Recent;   Good Remote;   Good  Judgement:  Impaired   Insight:  Shallow  Psychomotor Activity:  Decreased  Concentration:  Fair  Recall:  [employer] of Knowledge:Fair  Language: Fair  Akathisia:  No  Handed:  Right  AIMS (if indicated):     Assets:  Financial Resources/Insurance Housing Resilience Social Support  ADL's:  Intact  Cognition: WNL  Sleep:      Medical Decision Making: New problem, with additional work up planned, Review of Psycho-Social Stressors (1), Review or order clinical lab tests (1), Discuss test with performing physician (1), Review and summation of old records (2), Review of Medication Regimen & Side Effects (2) and Review of New Medication or Change in Dosage (2)  Treatment Plan Summary: Plan Case discussed with emergency room physician. Patient will be admitted to the psychiatry ward downstairs for further evaluation. Major concern about her poor self-care recently. Some concern about the possibility  of hidden suicidal ideation. Supportive counseling done with the patient. Reviewed medication. Reviewed labs. She appears to be medically stable at this point for admission.  Plan:  Recommend psychiatric Inpatient admission when medically cleared. Supportive therapy provided about ongoing stressors. Discussed crisis plan, support from social network, calling 911, coming to the Emergency Department, and calling Suicide Hotline. Disposition: Patient can be admitted to psychiatry at the convenience of the ward downstairs.  Zaul Hubers 06/20/2014 9:29 PM

## 2014-06-20 NOTE — ED Notes (Signed)
BEHAVIORAL HEALTH ROUNDING Patient sleeping: Yes.   Patient alert and oriented: yes Behavior appropriate: Yes.  ; If no, describe:  Nutrition and fluids offered: Yes  Toileting and hygiene offered: Yes  Sitter present: yes Law enforcement present: Yes  

## 2014-06-20 NOTE — ED Provider Notes (Signed)
[facility] Emergency Department Provider Note  ____________________________________________  Time seen: 6:45 AM  I have reviewed the triage vital signs and the nursing notes.   HISTORY  Chief Complaint No chief complaint on file.      HPI Pamela Mcmahon is a 69 y.o. female presents with hypoglycemia per EMS glucose was 38 1 amp of D50 given with subsequent blood sugar 290. Patient admits to hypoglycemia earlier in the night at approximately 2 AM she stated her glucose was 58. Patient's reason for calling EMS was for psychiatric evaluation secondary to depression and insomnia patient stated that her husband of 48 years committed suicide in February.     No past medical history on file.  There are no active problems to display for this patient.   No past surgical history on file.  No current outpatient prescriptions on file.  Allergies Review of patient's allergies indicates not on file.  No family history on file.  Social History History  Substance Use Topics  . Smoking status: Not on file  . Smokeless tobacco: Not on file  . Alcohol Use: Not on file    Review of Systems  Constitutional: Negative for fever. Eyes: Negative for visual changes. ENT: Negative for sore throat. Cardiovascular: Negative for chest pain. Respiratory: Negative for shortness of breath. Gastrointestinal: Negative for abdominal pain, vomiting and diarrhea. Genitourinary: Negative for dysuria. Musculoskeletal: Negative for back pain. Skin: Negative for rash. Neurological: Negative for headaches, focal weakness or numbness. Psychiatric: Depression and insomnia  10-point ROS otherwise negative.  ____________________________________________   PHYSICAL EXAM:  VITAL SIGNS: ED Triage Vitals  Enc Vitals Group     BP --      Pulse --      Resp --      Temp --      Temp src --      SpO2 --      Weight --      Height --      Head Cir --      Peak Flow --       Pain Score --      Pain Loc --      Pain Edu? --      Excl. in GC? --      Constitutional: Alert and oriented. Well appearing and in no distress. Eyes: Conjunctivae are normal. PERRL. Normal extraocular movements. ENT   Head: Normocephalic and atraumatic.   Nose: No congestion/rhinnorhea.   Mouth/Throat: Mucous membranes are moist.   Neck: No stridor. Hematological/Lymphatic/Immunilogical: No cervical lymphadenopathy. Cardiovascular: Normal rate, regular rhythm. Normal and symmetric distal pulses are present in all extremities. No murmurs, rubs, or gallops. Respiratory: Normal respiratory effort without tachypnea nor retractions. Breath sounds are clear and equal bilaterally. No wheezes/rales/rhonchi. Gastrointestinal: Soft and nontender. No distention. There is no CVA tenderness. Genitourinary: deferred Musculoskeletal: Nontender with normal range of motion in all extremities. No joint effusions.  No lower extremity tenderness nor edema. Neurologic:  Normal speech and language. No gross focal neurologic deficits are appreciated. Speech is normal.  Skin:  Skin is warm, dry and intact. No rash noted. Psychiatric: Depressed mood tearful  ____________________________________________    LABS (pertinent positives/negatives)  pending  ____________________________________________   EKG  Pending  ____________________________________________    RADIOLOGY  Pending  ____________________________________________     INITIAL IMPRESSION / ASSESSMENT AND PLAN / ED COURSE  Pertinent labs & imaging results that were available during my care of the patient were reviewed by me and  considered in my medical decision making (see chart for details).  History of physical exam consistent with depression and insomnia for which will consult psychiatry. In addition patient with hypoglycemia with long-acting sulfonylurea home  management.  ____________________________________________   FINAL CLINICAL IMPRESSION(S) / ED DIAGNOSES  Final diagnoses:  Hypoglycemia  Depression      Darci Current, MD 06/21/14 [id number]

## 2014-06-20 NOTE — ED Notes (Signed)
Called into room by patient.  Patient states she has done all she can do; has eaten 2 small bites of sandwich, small amount of applesauce, 2 cookies, small amount of chips and 1 cup of orange juice.  Hospitalist aware.  Received order to check CBG at 0930.

## 2014-06-20 NOTE — ED Notes (Signed)
BEHAVIORAL HEALTH ROUNDING Patient sleeping: No. Patient alert and oriented: yes Behavior appropriate: Yes.  ;  Nutrition and fluids offered: Yes  Toileting and hygiene offered: Yes  Sitter present: yes Law enforcement present: Yes  

## 2014-06-20 NOTE — ED Notes (Signed)
Pt given phone to call son 

## 2014-06-20 NOTE — ED Notes (Signed)
CBG completed with result of 146.

## 2014-06-20 NOTE — BH Assessment (Signed)
Assessment Note  Pamela Mcmahon is an 69 y.o. female, presents to the ED via EMS for c/o hypoglycemia; not eating. Per client, "I have not been myself since my husband died. He committed 2014-03-29. "He talked to me that day; he left everything on the bed; his glasses; wallet; and money clip; my son went into the kitchen; he looked and saw his father; he called his father's name; and then; we heard the shot. I went into the room and saw him; and all I could to think is why; I think about being with him; I'm so depressed."  Axis I: Major Depression, single episode Axis II: Deferred Axis III:  Past Medical History  Diagnosis Date  . Diabetes mellitus without complication   . Stroke   . Neuropathy   . Fibromyalgia   . Chronic hip pain     left   Axis IV: problems related to social environment, problems with access to health care services and problems with primary support group Axis V: 41-50 serious symptoms  Past Medical History:  Past Medical History  Diagnosis Date  . Diabetes mellitus without complication   . Stroke   . Neuropathy   . Fibromyalgia   . Chronic hip pain     left    Past Surgical History  Procedure Laterality Date  . Cholecystectomy    . Abdominal hysterectomy    . Cardiac surgery      stents    Family History: History reviewed. No pertinent family history.  Social History:  reports that she has been smoking Cigarettes.  She does not have any smokeless tobacco history on file. She reports that she does not drink alcohol or use illicit drugs.  Additional Social History:     CIWA: CIWA-Ar BP: 115/86 mmHg Pulse Rate: 86 COWS:    Allergies:  Allergies  Allergen Reactions  . Codeine Hives  . Diazepam Other (See Comments)    Reaction: Visual disturbance  . Gemfibrozil Other (See Comments)    Reaction: Unknown  . Lyrica [Pregabalin] Other (See Comments)    Reaction: Loss of balance, Pt states "it doesn't work."  . Tricor [Fenofibrate] Other (See  Comments)    Reaction: Unknown  . Vicodin [Hydrocodone-Acetaminophen] Hives    Home Medications:  (Not in a hospital admission)  OB/GYN Status:  No LMP recorded.  General Assessment Data Location of Assessment: [facility] ED TTS Assessment: In system Is this a Tele or Face-to-Face Assessment?: Face-to-Face Is this an Initial Assessment or a Re-assessment for this encounter?: Initial Assessment Marital status: Widowed [place] name: Archidillo Is patient pregnant?: No Pregnancy Status: No Living Arrangements: Alone Can pt return to current living arrangement?: Yes Admission Status: Voluntary Is patient capable of signing voluntary admission?: Yes Referral Source: Self/Family/Friend Insurance type: Medicare/UHC  Medical Screening Exam [facility] Walk-in ONLY) Medical Exam completed: Yes  Crisis Care Plan Living Arrangements: Alone Name of Psychiatrist: none Name of Therapist: none  Education Status Is patient currently in school?: No Current Grade: n/a Highest grade of school patient has completed: 12th Name of school: n/a Contact person: sons  Risk to self with the past 6 months Suicidal Ideation: Yes-Currently Present Has patient been a risk to self within the past 6 months prior to admission? : No Suicidal Intent: No Has patient had any suicidal intent within the past 6 months prior to admission? : No Is patient at risk for suicide?: Yes ("I wanted to be with my husband.") Suicidal Plan?: No Has patient had  any suicidal plan within the past 6 months prior to admission? : No Access to Means: Yes (pills) Specify Access to Suicidal Means: pills What has been your use of drugs/alcohol within the last 12 months?: none Previous Attempts/Gestures: No How many times?: 0 Other Self Harm Risks: 0 Triggers for Past Attempts: None known Intentional Self Injurious Behavior: None Family Suicide History: Yes (husband committed suicide 2 '2016) Recent stressful life event(s): Loss  (Comment) Persecutory voices/beliefs?: No Depression: Yes Depression Symptoms: Tearfulness, Despondent Substance abuse history and/or treatment for substance abuse?: No Suicide prevention information given to non-admitted patients: Yes  Risk to Others within the past 6 months Homicidal Ideation: No Does patient have any lifetime risk of violence toward others beyond the six months prior to admission? : No Thoughts of Harm to Others: No Current Homicidal Intent: No Current Homicidal Plan: No Access to Homicidal Means: No Identified Victim: none History of harm to others?: No Assessment of Violence: On admission Violent Behavior Description: none Does patient have access to weapons?: No Criminal Charges Pending?: No Does patient have a court date: No Is patient on probation?: No  Psychosis Hallucinations: None noted Delusions: None noted  Mental Status Report Appearance/Hygiene: In scrubs Eye Contact: Good Motor Activity: Unremarkable Speech: Logical/coherent Level of Consciousness: Alert, Crying Mood: Sad, Depressed Affect: Anxious Anxiety Level: Minimal Thought Processes: Coherent, Circumstantial Judgement: Partial Orientation: Person, Place, Situation, Appropriate for developmental age Obsessive Compulsive Thoughts/Behaviors: Moderate ("I keep thinking why he did it.")  Cognitive Functioning Concentration: Good Memory: Recent Intact, Remote Intact IQ: Average Insight: Fair Impulse Control: Fair Appetite: Fair Weight Loss: 0 Weight Gain: 0 Sleep: Decreased Total Hours of Sleep: 4 Vegetative Symptoms: None  ADLScreening [facility] Assessment Services) Patient's cognitive ability adequate to safely complete daily activities?: Yes Patient able to express need for assistance with ADLs?: Yes Independently performs ADLs?: Yes (appropriate for developmental age)  Prior Inpatient Therapy Prior Inpatient Therapy: No  Prior Outpatient Therapy Prior Outpatient Therapy:  No Does patient have an ACCT team?: No Does patient have Intensive In-House Services?  : No Does patient have [facility] services? : No Does patient have P4CC services?: No  ADL Screening (condition at time of admission) Patient's cognitive ability adequate to safely complete daily activities?: Yes Patient able to express need for assistance with ADLs?: Yes Independently performs ADLs?: Yes (appropriate for developmental age)       Abuse/Neglect Assessment (Assessment to be complete while patient is alone) Physical Abuse: Denies Verbal Abuse: Yes, past (Comment) Sexual Abuse: Denies Exploitation of patient/patient's resources: Denies Self-Neglect: Denies Values / Beliefs Cultural Requests During Hospitalization: None Spiritual Requests During Hospitalization: None Consults Spiritual Care Consult Needed: No Social Work Consult Needed: No Merchant navy officerAdvance Directives (For Healthcare) Does patient have an advance directive?: No, Yes Type of Advance Directive: Living will Does patient want to make changes to advanced directive?: No - Patient declined Copy of advanced directive(s) in chart?: No - copy requested    Additional Information 1:1 In Past 12 Months?: No CIRT Risk: No Elopement Risk: No Does patient have medical clearance?: Yes  Child/Adolescent Assessment Running Away Risk: Denies Bed-Wetting: Denies Destruction of Property: Denies Cruelty to Animals: Denies Stealing: Denies Rebellious/Defies Authority: Denies Satanic Involvement: Denies Archivistire Setting: Denies Problems at Progress Energy[employer]: Denies Gang Involvement: Denies  Disposition:  Disposition Initial Assessment Completed for this Encounter: Yes Disposition of Patient: Referred to (psych MD to see) Patient referred to: Other (Comment) (consult)  On Site Evaluation by:   Reviewed with Physician:  Dwan Bolt 06/20/2014 7:45 PM

## 2014-06-21 ENCOUNTER — Inpatient Hospital Stay
Admission: EM | Admit: 2014-06-21 | Discharge: 2014-06-21 | DRG: 881 | Disposition: A | Discharge status: Home / Self Care | Payer: 59 | Source: Intra-hospital | Attending: Psychiatry | Admitting: Psychiatry

## 2014-06-21 ENCOUNTER — Encounter: Payer: Self-pay | Admitting: Psychiatry

## 2014-06-21 DIAGNOSIS — F17210 Nicotine dependence, cigarettes, uncomplicated: Secondary | ICD-10-CM | POA: Diagnosis present

## 2014-06-21 DIAGNOSIS — M797 Fibromyalgia: Secondary | ICD-10-CM | POA: Diagnosis present

## 2014-06-21 DIAGNOSIS — F4321 Adjustment disorder with depressed mood: Secondary | ICD-10-CM | POA: Diagnosis present

## 2014-06-21 DIAGNOSIS — E119 Type 2 diabetes mellitus without complications: Secondary | ICD-10-CM

## 2014-06-21 DIAGNOSIS — F329 Major depressive disorder, single episode, unspecified: Principal | ICD-10-CM | POA: Diagnosis present

## 2014-06-21 DIAGNOSIS — Z8673 Personal history of transient ischemic attack (TIA), and cerebral infarction without residual deficits: Secondary | ICD-10-CM | POA: Diagnosis not present

## 2014-06-21 DIAGNOSIS — G8929 Other chronic pain: Secondary | ICD-10-CM | POA: Diagnosis present

## 2014-06-21 DIAGNOSIS — Z90710 Acquired absence of both cervix and uterus: Secondary | ICD-10-CM

## 2014-06-21 DIAGNOSIS — G4700 Insomnia, unspecified: Secondary | ICD-10-CM | POA: Diagnosis present

## 2014-06-21 DIAGNOSIS — R634 Abnormal weight loss: Secondary | ICD-10-CM | POA: Diagnosis present

## 2014-06-21 DIAGNOSIS — F419 Anxiety disorder, unspecified: Secondary | ICD-10-CM | POA: Diagnosis present

## 2014-06-21 DIAGNOSIS — Z79899 Other long term (current) drug therapy: Secondary | ICD-10-CM | POA: Diagnosis not present

## 2014-06-21 DIAGNOSIS — Z95818 Presence of other cardiac implants and grafts: Secondary | ICD-10-CM

## 2014-06-21 DIAGNOSIS — Z9049 Acquired absence of other specified parts of digestive tract: Secondary | ICD-10-CM | POA: Diagnosis present

## 2014-06-21 DIAGNOSIS — Z888 Allergy status to other drugs, medicaments and biological substances status: Secondary | ICD-10-CM

## 2014-06-21 DIAGNOSIS — M25559 Pain in unspecified hip: Secondary | ICD-10-CM | POA: Diagnosis present

## 2014-06-21 DIAGNOSIS — Z886 Allergy status to analgesic agent status: Secondary | ICD-10-CM | POA: Diagnosis not present

## 2014-06-21 LAB — GLUCOSE, CAPILLARY
GLUCOSE-CAPILLARY: 132 mg/dL — AB (ref 65–99)
Glucose-Capillary: 122 mg/dL — ABNORMAL HIGH (ref 65–99)

## 2014-06-21 MED ORDER — ALPRAZOLAM 0.5 MG PO TABS
0.5000 mg | ORAL_TABLET | Freq: Once | ORAL | Status: AC
  Administered 2014-06-21: 0.5 mg via ORAL

## 2014-06-21 MED ORDER — ALPRAZOLAM 0.5 MG PO TABS
ORAL_TABLET | ORAL | Status: AC
  Administered 2014-06-21: 0.5 mg via ORAL
  Filled 2014-06-21: qty 1

## 2014-06-21 MED ORDER — ACARBOSE 25 MG PO TABS
25.0000 mg | ORAL_TABLET | Freq: Three times a day (TID) | ORAL | Status: DC
  Administered 2014-06-21 (×2): 25 mg via ORAL
  Filled 2014-06-21 (×4): qty 1

## 2014-06-21 MED ORDER — LEVOTHYROXINE SODIUM 50 MCG PO TABS
50.0000 ug | ORAL_TABLET | Freq: Every day | ORAL | Status: DC
  Administered 2014-06-21: 50 ug via ORAL
  Filled 2014-06-21: qty 1

## 2014-06-21 MED ORDER — PANTOPRAZOLE SODIUM 40 MG PO TBEC
40.0000 mg | DELAYED_RELEASE_TABLET | Freq: Every day | ORAL | Status: DC
  Administered 2014-06-21: 40 mg via ORAL
  Filled 2014-06-21: qty 1

## 2014-06-21 MED ORDER — MIRTAZAPINE 15 MG PO TABS: 15.0000 mg | ORAL_TABLET | Freq: Every day | ORAL | Status: DC

## 2014-06-21 MED ORDER — MAGNESIUM HYDROXIDE 400 MG/5ML PO SUSP: 30.0000 mL | Freq: Every day | ORAL | Status: DC | PRN

## 2014-06-21 MED ORDER — ASPIRIN 81 MG PO CHEW
81.0000 mg | CHEWABLE_TABLET | Freq: Every day | ORAL | Status: DC
  Administered 2014-06-21: 81 mg via ORAL
  Filled 2014-06-21: qty 1

## 2014-06-21 MED ORDER — AMITRIPTYLINE HCL 25 MG PO TABS: 100.0000 mg | ORAL_TABLET | Freq: Every day | ORAL | Status: DC

## 2014-06-21 MED ORDER — MIGLITOL 50 MG PO TABS: 25.0000 mg | ORAL_TABLET | Freq: Three times a day (TID) | ORAL | Status: DC

## 2014-06-21 MED ORDER — ALUM & MAG HYDROXIDE-SIMETH 200-200-20 MG/5ML PO SUSP: 30.0000 mL | ORAL | Status: DC | PRN

## 2014-06-21 MED ORDER — AMITRIPTYLINE HCL 100 MG PO TABS: 100.0000 mg | ORAL_TABLET | Freq: Every day | ORAL | Status: AC

## 2014-06-21 MED ORDER — CLOPIDOGREL BISULFATE 75 MG PO TABS
75.0000 mg | ORAL_TABLET | Freq: Every day | ORAL | Status: DC
  Administered 2014-06-21: 75 mg via ORAL
  Filled 2014-06-21: qty 1

## 2014-06-21 MED ORDER — SIMVASTATIN 20 MG PO TABS: 20.0000 mg | ORAL_TABLET | Freq: Every day | ORAL | Status: DC

## 2014-06-21 MED ORDER — ALPRAZOLAM ER 0.5 MG PO TB24: 0.5000 mg | ORAL_TABLET | Freq: Every day | ORAL | Status: DC

## 2014-06-21 MED ORDER — FUROSEMIDE 20 MG PO TABS
40.0000 mg | ORAL_TABLET | Freq: Every day | ORAL | Status: DC
  Administered 2014-06-21: 40 mg via ORAL
  Filled 2014-06-21: qty 2

## 2014-06-21 NOTE — Progress Notes (Signed)
Recreation Therapy Notes  After patient assessment, patient refused 1:1 treatment sessions.  Jacquelynn CreeGreene,Johnhenry Tippin M, LRT/CTRS 06/21/2014 4:47 PM

## 2014-06-21 NOTE — Progress Notes (Signed)
[facility]  06/21/2014 10:23 AM  Participation Quality:  DID NOT ATTEND  Affect:    Cognitive:    Insight:    Engagement in Group:    Modes of Intervention:    Summary of Progress/Problems:  Cheron SchaumannBandi, Zaniya Mcaulay M 06/21/2014, 10:23 AM

## 2014-06-21 NOTE — [facility] Suicide Risk Assessment (Signed)
[facility] Admission Suicide Risk Assessment   Nursing information obtained from:    Demographic factors:    Current Mental Status:    Loss Factors:    Historical Factors:    Risk Reduction Factors:    Total Time spent with patient: 1 hour Principal Problem: <principal problem not specified> Diagnosis:   Patient Active Problem List   Diagnosis Date Noted  . Severe major depression, single episode [F32.2] 06/21/2014  . 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 Diabetes [E11.9] 06/20/2014  . Weight loss [R63.4] 06/20/2014  . Grief [F43.21] 06/20/2014     Continued Clinical Symptoms:  Alcohol Use Disorder Identification Test Final Score (AUDIT): 0 The "Alcohol Use Disorders Identification Test", Guidelines for Use in Primary Care, Second Edition.  World Science writer [facility]). Score between 0-7:  no or low risk or alcohol related problems. Score between 8-15:  moderate risk of alcohol related problems. Score between 16-19:  high risk of alcohol related problems. Score 20 or above:  warrants further diagnostic evaluation for alcohol dependence and treatment.   CLINICAL FACTORS:   Depression:   Insomnia Severe   Musculoskeletal: Strength & Muscle Tone: within normal limits Gait & Station: normal Patient leans: N/A  Psychiatric Specialty Exam: I reviewed and concur with findings on physical exam performed in the ER. Physical Exam  Nursing note and vitals reviewed.   Review of Systems  Musculoskeletal: Positive for myalgias and joint pain.  All other systems reviewed and are negative.   Blood pressure 113/74, pulse 80, temperature 98.6 F (37 C), temperature source Oral, resp. rate 20, height  (1.499 m), weight 55.792 kg (123 lb).Body mass index is 24.83 kg/(m^2).  General Appearance: Casual  Eye Contact::  Good  Speech:  Normal Rate  Volume:  Normal  Mood:  Euthymic  Affect:  Appropriate  Thought Process:  Goal Directed, Linear and Logical   Orientation:  Full (Time, Place, and Person)  Thought Content:  WDL  Suicidal Thoughts:  No  Homicidal Thoughts:  No  Memory:  Immediate;   Fair Recent;   Fair Remote;   Fair  Judgement:  Fair  Insight:  Fair  Psychomotor Activity:  Normal  Concentration:  Good  Recall:  Good  Fund of Knowledge:Good  Language: Good  Akathisia:  No  Handed:  Right  AIMS (if indicated):     Assets:  Communication Skills Desire for Improvement Financial Resources/Insurance Housing Intimacy Physical Health Social Support Talents/Skills  Sleep:     Cognition: WNL  ADL's:  Intact     COGNITIVE FEATURES THAT CONTRIBUTE TO RISK:  None    SUICIDE RISK:   Minimal: No identifiable suicidal ideation.  Patients presenting with no risk factors but with morbid ruminations; may be classified as minimal risk based on the severity of the depressive symptoms  PLAN OF CARE: Hospital admission, medication management, discharge planning.  Medical Decision Making:  New problem, with additional work up planned, Review of Psycho-Social Stressors (1), Review or order clinical lab tests (1), Review of Medication Regimen & Side Effects (2) and Review of New Medication or Change in Dosage (2)   Pamela Mcmahon is a 69 year old female with no past psychiatric history who became depressed after her husband committed suicide in February.  1. Suicidal ideation. The patient adamantly denies feeling suicidal ever. She has no intention or plan. She is able to contract for safety.  2. Mood. She takes Elavil at home for insomnia.  She was started on Remeron 15  mg at bedtime in the hospital for depression, anxiety, and appetite.  3. Diabetes. She was seen by medicine consult done in the emergency room. There is a recommendation to lowering Glyset from 50 to 25 mg 3 times daily before meals. She is to skip this medication is not eating.  4.  Hypothyroidism. She will continue Synthroid.  5. Chronic pain. She is in the care of  Dr. Laban EmperorNaveira at the pain clinic. She is maintained on fentanyl patches.  6. Dyslipidemia. We continue Zocor.  7. GERD. We continue pantoprazole.  8. History of stroke. Aspirin and Plavix are listed on her home medication list. The patient claims she no longer takes Plavix.  9. Disposition. She will be discharged to home. [facility]   I certify that inpatient services furnished can reasonably be expected to improve the patient's condition.   Jag Lenz 06/21/2014, 3:20 PM

## 2014-06-21 NOTE — ED Notes (Signed)
BEHAVIORAL HEALTH ROUNDING Patient sleeping: Yes.   Patient alert and oriented: not applicable Behavior appropriate: Yes.   Nutrition and fluids offered: Yes  Toileting and hygiene offered: Yes  Sitter present: yes Law enforcement present: Yes  

## 2014-06-21 NOTE — H&P (Signed)
Psychiatric Admission Assessment Adult  Patient Identification: Pamela Mcmahon MRN:  [id number] Date of Evaluation:  06/21/2014 Chief Complaint:  major psychotic depression Principal Diagnosis: Grief Diagnosis:   Patient Active Problem List   Diagnosis Date Noted  . Diabetes [E11.9] 06/20/2014  . Weight loss [R63.4] 06/20/2014  . Grief [F43.21] 06/20/2014   History of Present Illness::   Identifying data. Pamela Mcmahon is a 69 year old female with no past psychiatric history.  Chief complaint. I don't know how I ended up here.  History of present illness. The patient reports that she came to the emergency room with low sugar as recommended by her primary physician. Her sugars all night long where in the 30s. Once medically stabilized, she was asked if she would like to see a psychiatrist as there was a feeling that the patient is severely depressed. Her husband suicided in February. At the patient has been sad, tearful, anxious ever since. He shot himself in the backyard while his son was watching. He was not that when the patient came to the scene and was trying to resuscitate him. She has no idea why he committed suicide. She was unaware of any problems. They've been married for 40 some years. She considered him a friend. She is been in counseling with M.J. at the hospice. She has been taking Elavil for insomnia for sometime. She reports poor sleep, decreased appetite with 20 pound weight loss, anhedonia, feeling of guilt and helplessness and worthlessness, social isolation, crying spells, low energy and concentration. She adamantly denies that at any time since her husband's death or ever she felt suicidal. She has a special needs son living with her who needs her help. She denies psychotic symptoms. She denies symptoms suggestive of bipolar mania. She reports heightened anxiety for which he has been prescribed Xanax. She denies alcohol or illicit drugs or prescription pill abuse.  Past  psychiatric history. She has never been hospitalized, no suicide attempts no substance abuse.  Family psychiatric history. Nonreported  Social history. Comes from a big family of 12 children. She had a severe father but no abuse. She has 3 adult sons of her own 1 who lives with her. She intends to sell her house and moved to [place] to be closer to her family and eventually.  Total Time spent with patient: 1 hour  Past Medical History:  Past Medical History  Diagnosis Date  . Diabetes mellitus without complication   . Stroke   . Neuropathy   . Fibromyalgia   . Chronic hip pain     left    Past Surgical History  Procedure Laterality Date  . Cholecystectomy    . Abdominal hysterectomy    . Cardiac surgery      stents   Family History: History reviewed. No pertinent family history. Social History:  History  Alcohol Use No     History  Drug Use No    History   Social History  . Marital Status: Married    Spouse Name: N/A  . Number of Children: N/A  . Years of Education: N/A   Social History Main Topics  . Smoking status: Current Some Day Smoker    Types: Cigarettes  . Smokeless tobacco: Not on file  . Alcohol Use: No  . Drug Use: No  . Sexual Activity: No   Other Topics Concern  . None   Social History Narrative   Additional Social History:  Musculoskeletal: Strength & Muscle Tone: within normal limits Gait & Station: normal Patient leans: N/A  Psychiatric Specialty Exam: Physical Exam  Nursing note and vitals reviewed.   Review of Systems  Musculoskeletal: Positive for myalgias and joint pain.  All other systems reviewed and are negative.   Blood pressure 113/74, pulse 80, temperature 98.6 F (37 C), temperature source Oral, resp. rate 20, height  (1.499 m), weight 55.792 kg (123 lb).Body mass index is 24.83 kg/(m^2).  See SRA.                                                  Sleep:       Risk to Self: Is patient at risk for suicide?: No Risk to Others:   Prior Inpatient Therapy:   Prior Outpatient Therapy:    Alcohol Screening: 1. How often do you have a drink containing alcohol?: Never 2. How many drinks containing alcohol do you have on a typical day when you are drinking?: 1 or 2 3. How often do you have six or more drinks on one occasion?: Never Preliminary Score: 0 4. How often during the last year have you found that you were not able to stop drinking once you had started?: Never 5. How often during the last year have you failed to do what was normally expected from you becasue of drinking?: Never 6. How often during the last year have you needed a first drink in the morning to get yourself going after a heavy drinking session?: Never 7. How often during the last year have you had a feeling of guilt of remorse after drinking?: Never 8. How often during the last year have you been unable to remember what happened the night before because you had been drinking?: Never 9. Have you or someone else been injured as a result of your drinking?: No 10. Has a relative or friend or a doctor or another health worker been concerned about your drinking or suggested you cut down?: No Alcohol Use Disorder Identification Test Final Score (AUDIT): 0  Allergies:   Allergies  Allergen Reactions  . Codeine Hives  . Diazepam Other (See Comments)    Reaction: Visual disturbance  . Gemfibrozil Other (See Comments)    Reaction: Unknown  . Lyrica [Pregabalin] Other (See Comments)    Reaction: Loss of balance, Pt states "it doesn't work."  . Tricor [Fenofibrate] Other (See Comments)    Reaction: Unknown  . Vicodin [Hydrocodone-Acetaminophen] Hives   Lab Results:  Results for orders placed or performed during the hospital encounter of 06/21/14 (from the past 48 hour(s))  Glucose, capillary     Status: Abnormal   Collection Time: 06/21/14 11:48 AM  Result Value Ref Range    Glucose-Capillary 132 (H) 65 - 99 mg/dL   Current Medications: Current Facility-Administered Medications  Medication Dose Route Frequency Provider Last Rate Last Dose  . acarbose (PRECOSE) tablet 25 mg  25 mg Oral TID WC Audery Amel, MD   25 mg at 06/21/14 1207  . alum & mag hydroxide-simeth (MAALOX/MYLANTA) 200-200-20 MG/5ML suspension 30 mL  30 mL Oral Q4H PRN Audery Amel, MD      . amitriptyline (ELAVIL) tablet 100 mg  100 mg Oral QHS Audery Amel, MD      . aspirin chewable tablet 81 mg  81 mg Oral  Daily Audery Amel, MD   81 mg at 06/21/14 0941  . clopidogrel (PLAVIX) tablet 75 mg  75 mg Oral Daily Audery Amel, MD   75 mg at 06/21/14 0941  . furosemide (LASIX) tablet 40 mg  40 mg Oral Daily Audery Amel, MD   40 mg at 06/21/14 0942  . levothyroxine (SYNTHROID, LEVOTHROID) tablet 50 mcg  50 mcg Oral QAC breakfast Audery Amel, MD   50 mcg at 06/21/14 [phone]  . magnesium hydroxide (MILK OF MAGNESIA) suspension 30 mL  30 mL Oral Daily PRN Audery Amel, MD      . mirtazapine (REMERON) tablet 15 mg  15 mg Oral QHS Audery Amel, MD      . pantoprazole (PROTONIX) EC tablet 40 mg  40 mg Oral Daily Audery Amel, MD   40 mg at 06/21/14 0942  . simvastatin (ZOCOR) tablet 20 mg  20 mg Oral q1800 Audery Amel, MD       PTA Medications: Prescriptions prior to admission  Medication Sig Dispense Refill Last Dose  . acarbose (PRECOSE) 25 MG tablet Take 1 tablet (25 mg total) by mouth 3 (three) times daily with meals. Do not take if you are not eating a meal. 60 tablet 0 unknown at unknown  . ALPRAZolam (XANAX) 0.5 MG tablet Take 0.5 mg by mouth 3 (three) times daily as needed for anxiety.   PRN at PRN  . amitriptyline (ELAVIL) 100 MG tablet Take 100 mg by mouth at bedtime.   unknown at unknown  . aspirin EC 81 MG tablet Take 81 mg by mouth daily.   unknown at unknown  . clopidogrel (PLAVIX) 75 MG tablet Take 75 mg by mouth daily.   unknown at unknown  . fentaNYL (DURAGESIC - DOSED  MCG/HR) 75 MCG/HR Place 75 mcg onto the skin every 3 (three) days.   unknown at unknown  . furosemide (LASIX) 40 MG tablet Take 40 mg by mouth daily.   unknown at unknown  . levothyroxine (SYNTHROID, LEVOTHROID) 50 MCG tablet Take 50 mcg by mouth daily before breakfast.   unknown at unknown  . miglitol (GLYSET) 50 MG tablet Take 50 mg by mouth 3 (three) times daily with meals.   unknown at unknown  . nitroGLYCERIN (NITROLINGUAL) 0.4 MG/SPRAY spray Place 1 spray under the tongue every 5 (five) minutes x 3 doses as needed for chest pain.   PRN at PRN  . omeprazole (PRILOSEC) 40 MG capsule Take 40 mg by mouth daily.   unknown at unknown  . potassium chloride SA (K-DUR,KLOR-CON) 20 MEQ tablet Take 10 mEq by mouth 2 (two) times daily. Take 1/2 tablet in the morning and 1/2 tablet in the evening.   unknown at unknown  . simvastatin (ZOCOR) 40 MG tablet Take 40 mg by mouth at bedtime.   unknown at unknown  . zolpidem (AMBIEN) 10 MG tablet Take 10 mg by mouth at bedtime.   unknown at unknown    Previous Psychotropic Medications: Yes   Substance Abuse History in the last 12 months:  No.    Consequences of Substance Abuse: NA  Results for orders placed or performed during the hospital encounter of 06/21/14 (from the past 72 hour(s))  Glucose, capillary     Status: Abnormal   Collection Time: 06/21/14 11:48 AM  Result Value Ref Range   Glucose-Capillary 132 (H) 65 - 99 mg/dL    Observation Level/Precautions:  15 minute checks  Laboratory:  CBC Chemistry  Profile UDS UA  Psychotherapy:    Medications:    Consultations:    Discharge Concerns:    Estimated LOS:  Other:     Psychological Evaluations: No   Treatment Plan Summary: Daily contact with patient to assess and evaluate symptoms and progress in treatment and Medication management  Medical Decision Making:  New problem, with additional work up planned, Review of Psycho-Social Stressors (1), Review or order clinical lab tests (1),  Review of Medication Regimen & Side Effects (2) and Review of New Medication or Change in Dosage (2)   Ms. Kearney HardUribe is a 69 year old female with no past psychiatric history who became depressed after her husband committed suicide in February.  1. Suicidal ideation. The patient adamantly denies feeling suicidal ever. She has no intention or plan. She is able to contract for safety.  2. Mood. She takes Elavil at home for insomnia.  She was started on Remeron 15 mg at bedtime in the hospital for depression, anxiety, and appetite.  3. Diabetes. She was seen by medicine consult done in the emergency room. There is a recommendation to lowering Glyset from 50 to 25 mg 3 times daily before meals. She is to skip this medication is not eating.  4.  Hypothyroidism. She will continue Synthroid.  5. Chronic pain. She is in the care of Dr. Laban EmperorNaveira at the pain clinic. She is maintained on fentanyl patches.  6. Dyslipidemia. We continue Zocor.  7. GERD. We continue pantoprazole.  8. History of stroke. Aspirin and Plavix are listed on her home medication list. The patient claims she no longer takes Plavix.  9. Disposition. She will be discharged to home. [facility]   I certify that inpatient services furnished can reasonably be expected to improve the patient's condition.   Tyrene Nader 6/8/20163:37 PM

## 2014-06-21 NOTE — Progress Notes (Signed)
Recreation Therapy Notes  INPATIENT RECREATION THERAPY ASSESSMENT  Patient Details Name: Pamela Mcmahon MRN: [id number] DOB: 02/18/1945 Today's Date: 06/21/2014  Patient Stressors: Death  Coping Skills:   Avoidance, Exercise, Talking, Music, Other (Comment) Nicole Cella([place], read)  Personal Challenges: Problem-Solving  Leisure Interests (2+):  Individual - Other (Comment) (Go on vacation, spend time with son who has learning disabilities)  Awareness of Community Resources:  Yes  Community Resources:  [place], [place]  Current Use: Yes  If no, Barriers?:    Patient Strengths:  Accomplished a lot of things, made good decisions  Patient Identified Areas of Improvement:  No, just cope with a death  Current Recreation Participation:  [place]  Patient Goal for Hospitalization:  To get out of here  [place]  County of Residence:  [place]   Current SI (including self-harm):  No  Current HI:  No  Consent to Intern Participation: N/A   Jacquelynn CreeGreene,Eian Vandervelden M, LRT/CTRS 06/21/2014, 4:47 PM

## 2014-06-21 NOTE — Progress Notes (Signed)
D: Patient aware of discharge this shift . Patient  Denies suicidal and homicidal ideations Patient received all her belonging back . Patient returning home  A:Patient instructed on discharge , verbalize understanding, received prescriptions. Aware of follow up appointment date June 15,2016 at 1PM R Patient receptive to information received. Escorted to taxi to home

## 2014-06-21 NOTE — ED Notes (Signed)
BEHAVIORAL HEALTH ROUNDING Patient sleeping: No. Patient alert and oriented: yes Behavior appropriate: Yes.  ;  Nutrition and fluids offered: Yes  Toileting and hygiene offered: Yes  Sitter present: yes Law enforcement present: Yes  

## 2014-06-21 NOTE — [facility] Group Notes (Signed)
[facility] Group Notes:  (Nursing/MHT/Case Management/Adjunct)  Date:  06/21/2014  Time:  11:48 AM  Type of Therapy:  Group Therapy  Participation Level:  Did Not Attend   mmary of Progress/Problems:  [name] 06/21/2014, 11:48 AM

## 2014-06-21 NOTE — Progress Notes (Signed)
D. Patient demanding that she be moved to a private room, stated she talked with her MD about her diabetes and end up here. Voice of her husband death and is aware of some degree of depression . Isolates to her room no interaction with her peers. Anxious  Stated she has been on Xanax . Patient did bring in xanax  0.5 mg  (44.5# ) Appetite fair , voice no concerns around sleep. Continue to wear scrubs.A: Encourage patient participation with unit programming. Instructed on unit rules and whom her MD is  . R; Patient remain anxious , wants discharge . Denies suicidal ideation.

## 2014-06-21 NOTE — Progress Notes (Signed)
Recreation Therapy Notes  Date: 06.08.16 Time: 3:00 pm Location: Craft Room  Group Topic: Self-esteem  Goal Area(s) Addresses:  Patient will write down at least one positive trait about self. Patient will verbalize how it felt to see positive traits on paper.  Behavioral Response: Did not attend   Intervention: I Am  Activity: Patients were given a worksheet with the letter I on it and instructed to list as many positive things inside the letter as they can.  Education: LRT educated patient on ways to increase self-esteem   Education Outcome: Patient did not attend group.  Clinical Observations/Feedback: Patient did not attend group.  Jacquelynn CreeGreene,Kita Neace M, LRT/CTRS 06/21/2014 4:14 PM

## 2014-06-21 NOTE — Plan of Care (Signed)
Problem: Consults Goal[facility] Patient Education Outcome: Progressing Instruction on booklet

## 2014-06-21 NOTE — Progress Notes (Signed)
Patient search performed.  No contraband found.   

## 2014-06-21 NOTE — Tx Team (Signed)
Initial Interdisciplinary Treatment Plan    PATIENT STRESSORS: Loss of husband  Medical issues     PATIENT STRENGTHS: Ability for insight Active sense of humor Communication skills   PROBLEM LIST: Problem List/Patient Goals Date to be addressed Date deferred Reason deferred Estimated date of resolution  Depression  06/21/14     Diabetes 06/21/14                                                DISCHARGE CRITERIA:  Ability to meet basic life and health needs Medical problems require only outpatient monitoring  PRELIMINARY DISCHARGE PLAN: Return to previous living arrangement  PATIENT/FAMIILY INVOLVEMENT: This treatment plan has been presented to and reviewed with the patient, Laruth Bouchardrene S Bergland, and/or family member, .  The patient and family have been given the opportunity to ask questions and make suggestions.  Briceida Rasberry A Aniqa Hare 06/21/2014, 1:48 PM

## 2014-06-21 NOTE — Progress Notes (Signed)
[facility] LCSW Group Therapy  06/21/2014 2:47 PM  Type of Therapy:  Group Therapy  Participation Level:  Did Not Attend  Participation Quality:    Affect:    Cognitive:    Insight:    Engagement in Therapy:    Modes of Intervention:    Summary of Progress/Problems:  Pamela SchaumannBandi, Pamela Mcmahon 06/21/2014, 2:47 PM

## 2014-06-21 NOTE — Progress Notes (Signed)
69 yr old  female hx of depression, fibromyalgia and DM. Patient is IVC for worsening depression, and complication from diabetes mellitus. Upon admission patient' affect is flat but very pleasant and brightens upon approach. Patient currently denies SI/HI. Upon arrival,skin assessment was done; patient had multiple skin moles on her chest and back, a old midline incision from heart surgery. Patient was oriented to the unit, 15 minutes checks maintained, no acute distress noted.

## 2014-06-22 NOTE — Progress Notes (Signed)
AVS H&P faxed to [facility] for hospital follow-up

## 2014-06-28 ENCOUNTER — Ambulatory Visit: Payer: 59 | Admitting: Psychiatry

## 2014-07-03 ENCOUNTER — Other Ambulatory Visit: Payer: Self-pay

## 2014-07-03 DIAGNOSIS — F1910 Other psychoactive substance abuse, uncomplicated: Secondary | ICD-10-CM | POA: Insufficient documentation

## 2014-07-03 DIAGNOSIS — E119 Type 2 diabetes mellitus without complications: Secondary | ICD-10-CM | POA: Insufficient documentation

## 2014-07-03 DIAGNOSIS — E785 Hyperlipidemia, unspecified: Secondary | ICD-10-CM | POA: Insufficient documentation

## 2014-07-03 DIAGNOSIS — M797 Fibromyalgia: Secondary | ICD-10-CM | POA: Insufficient documentation

## 2014-07-03 DIAGNOSIS — N189 Chronic kidney disease, unspecified: Secondary | ICD-10-CM | POA: Insufficient documentation

## 2014-08-04 ENCOUNTER — Ambulatory Visit: Payer: 59 | Admitting: Psychiatry

## 2014-08-10 DIAGNOSIS — E119 Type 2 diabetes mellitus without complications: Secondary | ICD-10-CM | POA: Insufficient documentation

## 2014-08-11 ENCOUNTER — Emergency Department
Admission: EM | Admit: 2014-08-11 | Discharge: 2014-08-11 | Disposition: A | Discharge status: Home / Self Care | Payer: Medicare Other | Attending: Emergency Medicine | Admitting: Emergency Medicine

## 2014-08-11 ENCOUNTER — Emergency Department: Payer: Medicare Other

## 2014-08-11 ENCOUNTER — Encounter: Payer: Self-pay | Admitting: *Deleted

## 2014-08-11 DIAGNOSIS — Z7902 Long term (current) use of antithrombotics/antiplatelets: Secondary | ICD-10-CM | POA: Insufficient documentation

## 2014-08-11 DIAGNOSIS — G8929 Other chronic pain: Secondary | ICD-10-CM | POA: Diagnosis not present

## 2014-08-11 DIAGNOSIS — E119 Type 2 diabetes mellitus without complications: Secondary | ICD-10-CM | POA: Insufficient documentation

## 2014-08-11 DIAGNOSIS — Z79899 Other long term (current) drug therapy: Secondary | ICD-10-CM | POA: Insufficient documentation

## 2014-08-11 DIAGNOSIS — M25552 Pain in left hip: Secondary | ICD-10-CM | POA: Diagnosis not present

## 2014-08-11 DIAGNOSIS — N189 Chronic kidney disease, unspecified: Secondary | ICD-10-CM | POA: Diagnosis not present

## 2014-08-11 DIAGNOSIS — M25559 Pain in unspecified hip: Secondary | ICD-10-CM

## 2014-08-11 DIAGNOSIS — Z7982 Long term (current) use of aspirin: Secondary | ICD-10-CM | POA: Diagnosis not present

## 2014-08-11 DIAGNOSIS — Z720 Tobacco use: Secondary | ICD-10-CM | POA: Insufficient documentation

## 2014-08-11 DIAGNOSIS — M25551 Pain in right hip: Secondary | ICD-10-CM | POA: Diagnosis not present

## 2014-08-11 MED ORDER — HYDROMORPHONE HCL 1 MG/ML IJ SOLN
1.0000 mg | Freq: Once | INTRAMUSCULAR | Status: AC
  Administered 2014-08-11: 1 mg via INTRAVENOUS
  Filled 2014-08-11: qty 1

## 2014-08-11 MED ORDER — CYCLOBENZAPRINE HCL 10 MG PO TABS: 10.0000 mg | ORAL_TABLET | Freq: Three times a day (TID) | ORAL | Status: DC | PRN

## 2014-08-11 NOTE — Discharge Instructions (Signed)
Arthralgia °Your caregiver has diagnosed you as suffering from an arthralgia. Arthralgia means there is pain in a joint. This can come from many reasons including: °· Bruising the joint which causes soreness (inflammation) in the joint. °· Wear and tear on the joints which occur as we grow older (osteoarthritis). °· Overusing the joint. °· Various forms of arthritis. °· Infections of the joint. °Regardless of the cause of pain in your joint, most of these different pains respond to anti-inflammatory drugs and rest. The exception to this is when a joint is infected, and these cases are treated with antibiotics, if it is a bacterial infection. °HOME CARE INSTRUCTIONS  °· Rest the injured area for as long as directed by your caregiver. Then slowly start using the joint as directed by your caregiver and as the pain allows. Crutches as directed may be useful if the ankles, knees or hips are involved. If the knee was splinted or casted, continue use and care as directed. If an stretchy or elastic wrapping bandage has been applied today, it should be removed and re-applied every 3 to 4 hours. It should not be applied tightly, but firmly enough to keep swelling down. Watch toes and feet for swelling, bluish discoloration, coldness, numbness or excessive pain. If any of these problems (symptoms) occur, remove the ace bandage and re-apply more loosely. If these symptoms persist, contact your caregiver or return to this location. °· For the first 24 hours, keep the injured extremity elevated on pillows while lying down. °· Apply ice for 15-20 minutes to the sore joint every couple hours while awake for the first half day. Then 03-04 times per day for the first 48 hours. Put the ice in a plastic bag and place a towel between the bag of ice and your skin. °· Wear any splinting, casting, elastic bandage applications, or slings as instructed. °· Only take over-the-counter or prescription medicines for pain, discomfort, or fever as  directed by your caregiver. Do not use aspirin immediately after the injury unless instructed by your physician. Aspirin can cause increased bleeding and bruising of the tissues. °· If you were given crutches, continue to use them as instructed and do not resume weight bearing on the sore joint until instructed. °Persistent pain and inability to use the sore joint as directed for more than 2 to 3 days are warning signs indicating that you should see a caregiver for a follow-up visit as soon as possible. Initially, a hairline fracture (break in bone) may not be evident on X-rays. Persistent pain and swelling indicate that further evaluation, non-weight bearing or use of the joint (use of crutches or slings as instructed), or further X-rays are indicated. X-rays may sometimes not show a small fracture until a week or 10 days later. Make a follow-up appointment with your own caregiver or one to whom we have referred you. A radiologist (specialist in reading X-rays) may read your X-rays. Make sure you know how you are to obtain your X-ray results. Do not assume everything is normal if you do not hear from us. °SEEK MEDICAL CARE IF: °Bruising, swelling, or pain increases. °SEEK IMMEDIATE MEDICAL CARE IF:  °· Your fingers or toes are numb or blue. °· The pain is not responding to medications and continues to stay the same or get worse. °· The pain in your joint becomes severe. °· You develop a fever over 102° F (38.9° C). °· It becomes impossible to move or use the joint. °MAKE SURE YOU:  °·   Understand these instructions. °· Will watch your condition. °· Will get help right away if you are not doing well or get worse. °Document Released: 12/30/2004 Document Revised: 03/24/2011 Document Reviewed: 08/18/2007 °ExitCare® Patient Information ©2015 [employer]. This information is not intended to replace advice given to you by your health care provider. Make sure you discuss any questions you have with your health care  provider. ° °Hip Pain °Your hip is the joint between your upper legs and your lower pelvis. The bones, cartilage, tendons, and muscles of your hip joint perform a lot of work each day supporting your body weight and allowing you to move around. °Hip pain can range from a minor ache to severe pain in one or both of your hips. Pain may be felt on the inside of the hip joint near the groin, or the outside near the buttocks and upper thigh. You may have swelling or stiffness as well.  °HOME CARE INSTRUCTIONS  °· Take medicines only as directed by your health care provider. °· Apply ice to the injured area: °¨ Put ice in a plastic bag. °¨ Place a towel between your skin and the bag. °¨ Leave the ice on for 15-20 minutes at a time, 3-4 times a day. °· Keep your leg raised (elevated) when possible to lessen swelling. °· Avoid activities that cause pain. °· Follow specific exercises as directed by your health care provider. °· Sleep with a pillow between your legs on your most comfortable side. °· Record how often you have hip pain, the location of the pain, and what it feels like. °SEEK MEDICAL CARE IF:  °· You are unable to put weight on your leg. °· Your hip is red or swollen or very tender to touch. °· Your pain or swelling continues or worsens after 1 week. °· You have increasing difficulty walking. °· You have a fever. °SEEK IMMEDIATE MEDICAL CARE IF:  °· You have fallen. °· You have a sudden increase in pain and swelling in your hip. °MAKE SURE YOU:  °· Understand these instructions. °· Will watch your condition. °· Will get help right away if you are not doing well or get worse. °Document Released: 06/19/2009 Document Revised: 05/16/2013 Document Reviewed: 08/26/2012 °ExitCare® Patient Information ©2015 [employer]. This information is not intended to replace advice given to you by your health care provider. Make sure you discuss any questions you have with your health care provider. ° °

## 2014-08-11 NOTE — ED Notes (Addendum)
Pt diagnosed with bursitis yesterday and has been having pain for 8 days.  Xrays done yesterday. Pain 9/10.  Started on the left side and moved to right and has now radiated to the groin. Pt reports 3rd bout of bursitis.   Pain with ambulation. Wears Fentanyl Patch .

## 2014-08-11 NOTE — ED Provider Notes (Signed)
[facility] Emergency Department Provider Note  ____________________________________________  Time seen: Approximately 4:43 PM  I have reviewed the triage vital signs and the nursing notes.   HISTORY  Chief Complaint Hip Pain    HPI Pamela Mcmahon is a 69 y.o. female presents with complaints of bilateral hip pain. Patient states this tingling on for about 8 days. Saw her PCP yesterday diagnosed with bursitis still having pains today. Current medications include fentanyl 75 mg patch every 3 days.   Past Medical History  Diagnosis Date  . Diabetes mellitus without complication   . Stroke   . Neuropathy   . Fibromyalgia   . Chronic hip pain     left    Patient Active Problem List   Diagnosis Date Noted  . Chronic kidney disease 07/03/2014  . Diabetes mellitus, type 2 07/03/2014  . Fibrositis 07/03/2014  . HLD (hyperlipidemia) 07/03/2014  . Abuse, drug or alcohol 07/03/2014  . Diabetes 06/20/2014  . Weight loss 06/20/2014  . Grief 06/20/2014  . Anxiety, generalized 05/10/2014  . Arteriosclerosis of coronary artery 12/22/2013  . Clinical depression 12/22/2013  . Cerebrovascular accident, old 12/22/2013  . Abdominal apron 07/13/2013    Past Surgical History  Procedure Laterality Date  . Cholecystectomy    . Abdominal hysterectomy    . Cardiac surgery      stents    Current Outpatient Rx  Name  Route  Sig  Dispense  Refill  . acarbose (PRECOSE) 25 MG tablet   Oral   Take 1 tablet (25 mg total) by mouth 3 (three) times daily with meals. Do not take if you are not eating a meal.   60 tablet   0   . albuterol (PROAIR HFA) 108 (90 BASE) MCG/ACT inhaler   Inhalation   Inhale into the lungs.         . ALPRAZolam (XANAX) 0.5 MG tablet   Oral   Take 0.5 mg by mouth 3 (three) times daily as needed for anxiety.         Marland Kitchen amitriptyline (ELAVIL) 100 MG tablet   Oral   Take 1 tablet (100 mg total) by mouth at bedtime.   60 tablet   0    . aspirin EC 81 MG tablet   Oral   Take 81 mg by mouth daily.         . clopidogrel (PLAVIX) 75 MG tablet   Oral   Take 75 mg by mouth daily.         . cyclobenzaprine (FLEXERIL) 10 MG tablet   Oral   Take 1 tablet (10 mg total) by mouth every 8 (eight) hours as needed for muscle spasms.   30 tablet   1   . doxycycline (VIBRAMYCIN) 100 MG capsule               . fentaNYL (DURAGESIC - DOSED MCG/HR) 75 MCG/HR   Transdermal   Place 75 mcg onto the skin every 3 (three) days.         . furosemide (LASIX) 40 MG tablet   Oral   Take 40 mg by mouth daily.         Marland Kitchen glucose blood (ONE TOUCH ULTRA TEST) test strip      Use once daily. DX 250.00         . glyBURIDE (DIABETA) 5 MG tablet   Oral   Take by mouth.         Marland Kitchen HYDROcodone-acetaminophen (NORCO/VICODIN)  5-325 MG per tablet               . isosorbide mononitrate (IMDUR) 30 MG 24 hr tablet   Oral   Take by mouth.         . levothyroxine (SYNTHROID, LEVOTHROID) 50 MCG tablet   Oral   Take 50 mcg by mouth daily before breakfast.         . metoprolol succinate (TOPROL-XL) 100 MG 24 hr tablet      1 tab by mouth daily at am         . miglitol (GLYSET) 50 MG tablet   Oral   Take 0.5 tablets (25 mg total) by mouth 3 (three) times daily with meals. Do not take Glyset if not eating.   90 tablet   0   . mirtazapine (REMERON) 15 MG tablet   Oral   Take 1 tablet (15 mg total) by mouth at bedtime.   30 tablet   0   . nitroGLYCERIN (NITROLINGUAL) 0.4 MG/SPRAY spray   Sublingual   Place 1 spray under the tongue every 5 (five) minutes x 3 doses as needed for chest pain.         Marland Kitchen omeprazole (PRILOSEC) 40 MG capsule   Oral   Take 40 mg by mouth daily.         Letta Pate DELICA LANCETS 33G MISC      Use 1 Units once daily.         . potassium chloride SA (K-DUR,KLOR-CON) 20 MEQ tablet   Oral   Take 10 mEq by mouth 2 (two) times daily. Take 1/2 tablet in the morning and 1/2 tablet in  the evening.         . simvastatin (ZOCOR) 40 MG tablet   Oral   Take 40 mg by mouth at bedtime.         Marland Kitchen tiZANidine (ZANAFLEX) 2 MG tablet   Oral   Take by mouth.         . triamcinolone cream (KENALOG) 0.1 %      Apply to affected areas 2 times per day         . zolpidem (AMBIEN) 10 MG tablet   Oral   Take 10 mg by mouth at bedtime.           Allergies Codeine; Codeine sulfate; Diazepam; Gemfibrozil; Lyrica; Tricor; and Vicodin  No family history on file.  Social History History  Substance Use Topics  . Smoking status: Current Some Day Smoker    Types: Cigarettes  . Smokeless tobacco: Not on file  . Alcohol Use: No    Review of Systems Constitutional: No fever/chills Eyes: No visual changes. ENT: No sore throat. Cardiovascular: Denies chest pain. Respiratory: Denies shortness of breath. Gastrointestinal: No abdominal pain.  No nausea, no vomiting.  No diarrhea.  No constipation. Genitourinary: Negative for dysuria. Musculoskeletal: Positive for bilateral hip pain left greater than right. Skin: Negative for rash. Neurological: Negative for headaches, focal weakness or numbness.  10-point ROS otherwise negative.  ____________________________________________   PHYSICAL EXAM:  VITAL SIGNS: ED Triage Vitals  Enc Vitals Group     BP 08/11/14 1556 110/65 mmHg     Pulse Rate 08/11/14 1556 86     Resp 08/11/14 1556 18     Temp 08/11/14 1556 98.4 F (36.9 C)     Temp Source 08/11/14 1556 Oral     SpO2 08/11/14 1556 96 %     Weight  08/11/14 1556 125 lb (56.7 kg)     Height 08/11/14 1556 4\' 11"  (1.499 m)     Head Cir --      Peak Flow --      Pain Score 08/11/14 1556 7     Pain Loc --      Pain Edu? --      Excl. in GC? --     Constitutional: Alert and oriented. Well appearing and in no acute distress. Eyes: Conjunctivae are normal. PERRL. EOMI. Head: Atraumatic. Nose: No congestion/rhinnorhea. Mouth/Throat: Mucous membranes are moist.   Oropharynx non-erythematous. Neck: No stridor.   Cardiovascular: Normal rate, regular rhythm. Grossly normal heart sounds.  Good peripheral circulation. Respiratory: Normal respiratory effort.  No retractions. Lungs CTAB. Gastrointestinal: Soft and nontender. No distention. No abdominal bruits. No CVA tenderness. Musculoskeletal: No lower extremity tenderness nor edema.  No joint effusions. Neurologic:  Normal speech and language. No gross focal neurologic deficits are appreciated. No gait instability. Skin:  Skin is warm, dry and intact. No rash noted. Psychiatric: Mood and affect are normal. Speech and behavior are normal.  ____________________________________________   LABS (all labs ordered are listed, but only abnormal results are displayed)  Labs Reviewed - No data to display ____________________________________________   RADIOLOGY  CT pelvic without contrast unremarkable interpreted by radiologist and reviewed by myself. ____________________________________________   PROCEDURES  Procedure(s) performed: None  Critical Care performed: No  ____________________________________________   INITIAL IMPRESSION / ASSESSMENT AND PLAN / ED COURSE  Pertinent labs & imaging results that were available during my care of the patient were reviewed by me and considered in my medical decision making (see chart for details).  Acute bilateral hip pain. History of bursitis. Currently on fentanyl patch. Rx given for Flexeril 10 mg 3 times a day #30 no refills patient follow-up with her PCP or return to the ER with any worsening symptomology. ____________________________________________   FINAL CLINICAL IMPRESSION(S) / ED DIAGNOSES  Final diagnoses:  Hip pain, chronic, unspecified laterality      Evangeline Dakin, PA-C 08/11/14 1750  Arnaldo Natal, MD 08/11/14 [phone]

## 2014-08-11 NOTE — ED Notes (Signed)
Pt reports bilateral hip pain (L>R) for 8 days. Denies injury, hx of bursitis, has fentanyl patch on at this time "does nothing."

## 2014-08-11 NOTE — ED Notes (Signed)
Patient transported to CT 

## 2014-09-11 ENCOUNTER — Other Ambulatory Visit: Payer: Self-pay | Admitting: Orthopedic Surgery

## 2014-09-11 DIAGNOSIS — M5416 Radiculopathy, lumbar region: Secondary | ICD-10-CM

## 2014-09-14 ENCOUNTER — Ambulatory Visit
Admission: RE | Admit: 2014-09-14 | Discharge: 2014-09-14 | Disposition: A | Discharge status: Home / Self Care | Payer: Medicare Other | Source: Ambulatory Visit | Attending: Orthopedic Surgery | Admitting: Orthopedic Surgery

## 2014-09-14 DIAGNOSIS — M5186 Other intervertebral disc disorders, lumbar region: Secondary | ICD-10-CM | POA: Diagnosis not present

## 2014-09-14 DIAGNOSIS — M4806 Spinal stenosis, lumbar region: Secondary | ICD-10-CM | POA: Insufficient documentation

## 2014-09-14 DIAGNOSIS — M5416 Radiculopathy, lumbar region: Secondary | ICD-10-CM

## 2014-11-20 ENCOUNTER — Encounter: Payer: Self-pay | Admitting: Pain Medicine

## 2014-11-20 ENCOUNTER — Ambulatory Visit: Payer: Medicare Other | Attending: Pain Medicine | Admitting: Pain Medicine

## 2014-11-20 ENCOUNTER — Other Ambulatory Visit: Payer: Self-pay | Admitting: Pain Medicine

## 2014-11-20 VITALS — BP 153/45 | HR 76 | Temp 98.6°F | Resp 18 | Ht 59.0 in | Wt 135.0 lb

## 2014-11-20 DIAGNOSIS — M4726 Other spondylosis with radiculopathy, lumbar region: Secondary | ICD-10-CM

## 2014-11-20 DIAGNOSIS — Z79891 Long term (current) use of opiate analgesic: Secondary | ICD-10-CM | POA: Diagnosis not present

## 2014-11-20 DIAGNOSIS — Z954 Presence of other heart-valve replacement: Secondary | ICD-10-CM

## 2014-11-20 DIAGNOSIS — M479 Spondylosis, unspecified: Secondary | ICD-10-CM

## 2014-11-20 DIAGNOSIS — Z9889 Other specified postprocedural states: Secondary | ICD-10-CM | POA: Insufficient documentation

## 2014-11-20 DIAGNOSIS — M5414 Radiculopathy, thoracic region: Secondary | ICD-10-CM | POA: Insufficient documentation

## 2014-11-20 DIAGNOSIS — Z86718 Personal history of other venous thrombosis and embolism: Secondary | ICD-10-CM

## 2014-11-20 DIAGNOSIS — Q762 Congenital spondylolisthesis: Secondary | ICD-10-CM

## 2014-11-20 DIAGNOSIS — M4804 Spinal stenosis, thoracic region: Secondary | ICD-10-CM | POA: Diagnosis not present

## 2014-11-20 DIAGNOSIS — E785 Hyperlipidemia, unspecified: Secondary | ICD-10-CM | POA: Insufficient documentation

## 2014-11-20 DIAGNOSIS — S32000A Wedge compression fracture of unspecified lumbar vertebra, initial encounter for closed fracture: Secondary | ICD-10-CM | POA: Insufficient documentation

## 2014-11-20 DIAGNOSIS — M545 Low back pain, unspecified: Secondary | ICD-10-CM

## 2014-11-20 DIAGNOSIS — M797 Fibromyalgia: Secondary | ICD-10-CM | POA: Insufficient documentation

## 2014-11-20 DIAGNOSIS — F419 Anxiety disorder, unspecified: Secondary | ICD-10-CM | POA: Insufficient documentation

## 2014-11-20 DIAGNOSIS — Z952 Presence of prosthetic heart valve: Secondary | ICD-10-CM | POA: Diagnosis not present

## 2014-11-20 DIAGNOSIS — G8929 Other chronic pain: Secondary | ICD-10-CM | POA: Diagnosis not present

## 2014-11-20 DIAGNOSIS — Z79899 Other long term (current) drug therapy: Secondary | ICD-10-CM

## 2014-11-20 DIAGNOSIS — M4316 Spondylolisthesis, lumbar region: Secondary | ICD-10-CM | POA: Insufficient documentation

## 2014-11-20 DIAGNOSIS — E119 Type 2 diabetes mellitus without complications: Secondary | ICD-10-CM | POA: Diagnosis not present

## 2014-11-20 DIAGNOSIS — M47816 Spondylosis without myelopathy or radiculopathy, lumbar region: Secondary | ICD-10-CM | POA: Diagnosis not present

## 2014-11-20 DIAGNOSIS — F1190 Opioid use, unspecified, uncomplicated: Secondary | ICD-10-CM | POA: Insufficient documentation

## 2014-11-20 DIAGNOSIS — M47814 Spondylosis without myelopathy or radiculopathy, thoracic region: Secondary | ICD-10-CM | POA: Diagnosis not present

## 2014-11-20 DIAGNOSIS — S32000S Wedge compression fracture of unspecified lumbar vertebra, sequela: Secondary | ICD-10-CM

## 2014-11-20 DIAGNOSIS — M4856XS Collapsed vertebra, not elsewhere classified, lumbar region, sequela of fracture: Secondary | ICD-10-CM | POA: Diagnosis not present

## 2014-11-20 DIAGNOSIS — Z7901 Long term (current) use of anticoagulants: Secondary | ICD-10-CM | POA: Insufficient documentation

## 2014-11-20 DIAGNOSIS — I482 Chronic atrial fibrillation, unspecified: Secondary | ICD-10-CM

## 2014-11-20 DIAGNOSIS — M546 Pain in thoracic spine: Secondary | ICD-10-CM

## 2014-11-20 DIAGNOSIS — Z87891 Personal history of nicotine dependence: Secondary | ICD-10-CM | POA: Insufficient documentation

## 2014-11-20 DIAGNOSIS — M48061 Spinal stenosis, lumbar region without neurogenic claudication: Secondary | ICD-10-CM | POA: Insufficient documentation

## 2014-11-20 DIAGNOSIS — M549 Dorsalgia, unspecified: Secondary | ICD-10-CM | POA: Diagnosis present

## 2014-11-20 DIAGNOSIS — F1120 Opioid dependence, uncomplicated: Secondary | ICD-10-CM

## 2014-11-20 DIAGNOSIS — M4806 Spinal stenosis, lumbar region: Secondary | ICD-10-CM | POA: Diagnosis not present

## 2014-11-20 DIAGNOSIS — M4310 Spondylolisthesis, site unspecified: Secondary | ICD-10-CM | POA: Insufficient documentation

## 2014-11-20 DIAGNOSIS — Z5181 Encounter for therapeutic drug level monitoring: Secondary | ICD-10-CM | POA: Insufficient documentation

## 2014-11-20 HISTORY — DX: Personal history of other venous thrombosis and embolism: Z86.718

## 2014-11-20 HISTORY — DX: Presence of prosthetic heart valve: Z95.2

## 2014-11-20 MED ORDER — FENTANYL 75 MCG/HR TD PT72
75.0000 ug | MEDICATED_PATCH | TRANSDERMAL | Status: DC
Start: 2014-11-20 — End: 2015-03-01

## 2014-11-20 MED ORDER — CYCLOBENZAPRINE HCL 5 MG PO TABS: 5.0000 mg | ORAL_TABLET | Freq: Three times a day (TID) | ORAL | Status: DC | PRN

## 2014-11-20 MED ORDER — FENTANYL 75 MCG/HR TD PT72: 75.0000 ug | MEDICATED_PATCH | TRANSDERMAL | Status: DC

## 2014-11-20 NOTE — Progress Notes (Signed)
Patient's Name: Pamela Mcmahon MRN: [id number] DOB: August 30, 1945 DOS: 11/20/2014  Primary Reason(s) for Visit: Encounter for Medication Management. CC: Back Pain   HPI:   Ms. Sandell is a 69 y.o. year old, female patient, who returns today as an established patient. She has Diabetes (HCC); Weight loss; Grief; Arteriosclerosis of coronary artery; Chronic kidney disease; Clinical depression; Diabetes mellitus, type 2 (HCC); Fibrositis; Anxiety, generalized; Cerebrovascular accident, old; HLD (hyperlipidemia); Abdominal apron; Abuse, drug or alcohol; Chronic pain; Long term current use of opiate analgesic; Long term prescription opiate use; Opiate use; Opiate dependence (HCC); Encounter for therapeutic drug level monitoring; Fibromyalgia; Type 2 diabetes mellitus (HCC); Current use of long term anticoagulation (Plavix); History of thromboembolism; Chronic atrial fibrillation (HCC); History of heart valve replacement with mechanical valve; Chronic upper back pain; Thoracic spondylosis; Thoracic spinal stenosis (T9-10 and T10-11); Thoracic foraminal stenosis (Left T9-10); Thoracic facet arthropathy; Thoracic radiculitis; Chronic low back pain; Lumbar spondylosis; Lumbar compression fracture (HCC) (L1 20-30%); Status post kyphoplasty (L1); Lumbar spinal stenosis (L3-4 and L4-5); Grade 1 (6 mm) Anterolisthesis of L4 over L5.; Lumbar facet arthropathy; and Lumbar facet syndrome on her problem list.. Her primarily concern today is the Back Pain    The patient returns to the clinic today indicating that she is doing well on her medications and she does not want to go any higher on the dose. Today's Pain Score: 5  Reported level of pain is incompatible with clinical obrservations. This may be secondary to a possible lack of understanding on how the pain scale works. Pain Type: Chronic pain Pain Location: Back Pain Orientation: Lower Pain Descriptors / Indicators: Aching Pain Frequency: Intermittent  Date of Last  Visit:   08/30/2014 Service Provided on Last Visit:   Medication management evaluation.  Pharmacotherapy Review:   Side-effects or Adverse reactions: None reported. Effectiveness: Described as relatively effective, allowing for increase in activities of daily living (ADL). Onset of action: Within expected pharmacological parameters. Duration of action: Within normal limits for medication. Peak effect: Timing and results are as within normal expected parameters. [place] PMP: Compliant with practice rules and regulations. Last UDS available in the system:     Component Value Date/Time   LABOPIA NEGATIVE 04/06/2013 0228   LABBENZ POSITIVE 04/06/2013 0228   AMPHETMU NEGATIVE 04/06/2013 0228   THCU NEGATIVE 04/06/2013 0228   LABBARB NEGATIVE 04/06/2013 0228    DST: Compliant with practice rules and regulations. Lab work: No new labs ordered by our practice. Treatment compliance: Compliant. Substance Use Disorder (SUD) Risk Level: Low Planned course of action: Continue therapy as is.  Allergies: Ms. Robley is allergic to codeine; codeine sulfate; diazepam; gemfibrozil; lyrica; tricor; and vicodin.  Meds: The patient has a current medication list which includes the following prescription(s): albuterol, alprazolam, amitriptyline, aspirin ec, vitamin d3, clopidogrel, cyclobenzaprine, fentanyl, fentanyl, fentanyl, furosemide, glucose blood, glyburide, levothyroxine, nitroglycerin, omeprazole, onetouch delica lancets 33g, potassium chloride sa, simvastatin, and zolpidem. Requested Prescriptions   Signed Prescriptions Disp Refills  . fentaNYL (DURAGESIC - DOSED MCG/HR) 75 MCG/HR 10 patch 0    Sig: Place 1 patch (75 mcg total) onto the skin every 3 (three) days.  . fentaNYL (DURAGESIC - DOSED MCG/HR) 75 MCG/HR 10 patch 0    Sig: Place 1 patch (75 mcg total) onto the skin every 3 (three) days.  . fentaNYL (DURAGESIC - DOSED MCG/HR) 75 MCG/HR 10 patch 0    Sig: Place 1 patch (75 mcg total) onto the  skin every 3 (three) days.  . cyclobenzaprine (  FLEXERIL) 5 MG tablet 90 tablet 2    Sig: Take 1 tablet (5 mg total) by mouth 3 (three) times daily as needed for muscle spasms.    ROS: Constitutional: Afebrile, no chills, well hydrated and well nourished Gastrointestinal: negative Musculoskeletal:negative Neurological: negative Behavioral/Psych: negative  PFSH: Medical:  Ms. Kearney HardUribe  has a past medical history of Diabetes mellitus without complication (HCC); Stroke [facility](HCC); Neuropathy (HCC); Fibromyalgia; Chronic hip pain; History of thromboembolism (11/20/2014); and History of heart valve replacement with mechanical valve (11/20/2014). Family: family history is not on file. Surgical:  has past surgical history that includes Cholecystectomy; Abdominal hysterectomy; and Cardiac surgery. Tobacco:  reports that she quit smoking about 8 months ago. Her smoking use included Cigarettes. She does not have any smokeless tobacco history on file. Alcohol:  reports that she does not drink alcohol. Drug:  reports that she does not use illicit drugs.  Physical Exam: Vitals:  Today's Vitals   11/20/14 1514  BP: 153/45  Pulse: 76  Temp: 98.6 F (37 C)  TempSrc: Oral  Resp: 18  Height: 4\' 11"  (1.499 m)  Weight: 135 lb (61.236 kg)  SpO2: 94%  PainSc: 5   PainLoc: Back  Calculated BMI: Body mass index is 27.25 kg/(m^2). General appearance: alert, cooperative, appears stated age, distracted, no distress and moderately obese Eyes: conjunctivae/corneas clear. PERRL, EOM's intact. Fundi benign. Lungs: No evidence respiratory distress, no audible rales or ronchi and no use of accessory muscles of respiration Neck: no adenopathy, no carotid bruit, no JVD, supple, symmetrical, trachea midline and thyroid not enlarged, symmetric, no tenderness/mass/nodules Back: symmetric, no curvature. ROM normal. No CVA tenderness. Extremities: extremities normal, atraumatic, no cyanosis or edema Pulses: 2+ and  symmetric Skin: Skin color, texture, turgor normal. No rashes or lesions Neurologic: Grossly normal    Assessment: Encounter Diagnosis:  Primary Diagnosis: Chronic pain [G89.29]  Plan: Karena Addisonrene was seen today for back pain.  Diagnoses and all orders for this visit:  Chronic pain -     fentaNYL (DURAGESIC - DOSED MCG/HR) 75 MCG/HR; Place 1 patch (75 mcg total) onto the skin every 3 (three) days. -     fentaNYL (DURAGESIC - DOSED MCG/HR) 75 MCG/HR; Place 1 patch (75 mcg total) onto the skin every 3 (three) days. -     fentaNYL (DURAGESIC - DOSED MCG/HR) 75 MCG/HR; Place 1 patch (75 mcg total) onto the skin every 3 (three) days. -     cyclobenzaprine (FLEXERIL) 5 MG tablet; Take 1 tablet (5 mg total) by mouth 3 (three) times daily as needed for muscle spasms.  Long term current use of opiate analgesic -     Drugs of abuse screen w/o alc, rtn urine-sln; Future  Long term prescription opiate use  Opiate use  Uncomplicated opioid dependence (HCC)  Encounter for therapeutic drug level monitoring  Current use of long term anticoagulation (Plavix)  History of thromboembolism  Chronic atrial fibrillation (HCC)  History of heart valve replacement with mechanical valve  Chronic upper back pain  Thoracic spondylosis  Thoracic spinal stenosis (T9-10 and T10-11)  Thoracic foraminal stenosis (Left T9-10)  Thoracic facet arthropathy  Thoracic radiculitis  Chronic low back pain  Osteoarthritis of spine with radiculopathy, lumbar region  Lumbar compression fracture, sequela  Status post kyphoplasty (L1)  Lumbar spinal stenosis (L3-4 and L4-5)  Grade 1 (6 mm) Anterolisthesis of L4 over L5.  Lumbar facet arthropathy  Lumbar facet syndrome     Patient Instructions  A prescription for duragesic patch x 3  was given to you today.  A prescription for flexeril was sent to your pharmacy and should be available for pickup today.   Medications discontinued today:   Medications Discontinued During This Encounter  Medication Reason  . tiZANidine (ZANAFLEX) 2 MG tablet Expired Prescription  . cyclobenzaprine (FLEXERIL) 10 MG tablet Completed Course  . doxycycline (VIBRAMYCIN) 100 MG capsule Completed Course  . HYDROcodone-acetaminophen (NORCO/VICODIN) 5-325 MG per tablet Discontinued by provider  . fentaNYL (DURAGESIC - DOSED MCG/HR) 75 MCG/HR Reorder  . cyclobenzaprine (FLEXERIL) 5 MG tablet Reorder  . acarbose (PRECOSE) 25 MG tablet Discontinued by provider  . isosorbide mononitrate (IMDUR) 30 MG 24 hr tablet Discontinued by provider  . metoprolol succinate (TOPROL-XL) 100 MG 24 hr tablet Discontinued by provider  . miglitol (GLYSET) 50 MG tablet Discontinued by provider  . mirtazapine (REMERON) 15 MG tablet Discontinued by provider  . PARoxetine (PAXIL) 40 MG tablet Discontinued by provider  . triamcinolone cream (KENALOG) 0.1 % Therapy completed   Medications administered today:  Ms. Barre had no medications administered during this visit.  Primary Care Physician: No primary care provider on file. Location: [facility] Note by: Shailey Butterbaugh A. Laban Emperor, M.D, DABA, DABAPM, DABPM, DABIPP, FIPP

## 2014-11-20 NOTE — Progress Notes (Deleted)
UDS:  11/20/14

## 2014-11-20 NOTE — Patient Instructions (Signed)
A prescription for duragesic patch x 3 was given to you today.  A prescription for flexeril was sent to your pharmacy and should be available for pickup today.

## 2014-11-28 LAB — TOXASSURE SELECT 13 (MW), URINE: PDF: 0

## 2014-12-19 ENCOUNTER — Other Ambulatory Visit: Payer: Self-pay | Admitting: Pain Medicine

## 2015-02-26 ENCOUNTER — Encounter: Payer: Self-pay | Admitting: Pain Medicine

## 2015-03-01 ENCOUNTER — Encounter: Payer: Self-pay | Admitting: Pain Medicine

## 2015-03-01 ENCOUNTER — Ambulatory Visit: Payer: Medicare Other | Attending: Pain Medicine | Admitting: Pain Medicine

## 2015-03-01 ENCOUNTER — Other Ambulatory Visit: Payer: Self-pay | Admitting: Pain Medicine

## 2015-03-01 VITALS — BP 104/91 | HR 78 | Temp 97.9°F | Resp 16 | Ht 59.0 in | Wt 130.0 lb

## 2015-03-01 DIAGNOSIS — F411 Generalized anxiety disorder: Secondary | ICD-10-CM | POA: Diagnosis not present

## 2015-03-01 DIAGNOSIS — F1120 Opioid dependence, uncomplicated: Secondary | ICD-10-CM | POA: Diagnosis not present

## 2015-03-01 DIAGNOSIS — E785 Hyperlipidemia, unspecified: Secondary | ICD-10-CM | POA: Insufficient documentation

## 2015-03-01 DIAGNOSIS — G8929 Other chronic pain: Secondary | ICD-10-CM | POA: Diagnosis not present

## 2015-03-01 DIAGNOSIS — M4806 Spinal stenosis, lumbar region: Secondary | ICD-10-CM | POA: Diagnosis not present

## 2015-03-01 DIAGNOSIS — N189 Chronic kidney disease, unspecified: Secondary | ICD-10-CM | POA: Insufficient documentation

## 2015-03-01 DIAGNOSIS — Z8673 Personal history of transient ischemic attack (TIA), and cerebral infarction without residual deficits: Secondary | ICD-10-CM | POA: Diagnosis not present

## 2015-03-01 DIAGNOSIS — I482 Chronic atrial fibrillation: Secondary | ICD-10-CM | POA: Insufficient documentation

## 2015-03-01 DIAGNOSIS — Z86718 Personal history of other venous thrombosis and embolism: Secondary | ICD-10-CM | POA: Insufficient documentation

## 2015-03-01 DIAGNOSIS — Z79891 Long term (current) use of opiate analgesic: Secondary | ICD-10-CM

## 2015-03-01 DIAGNOSIS — M545 Low back pain: Secondary | ICD-10-CM | POA: Insufficient documentation

## 2015-03-01 DIAGNOSIS — E119 Type 2 diabetes mellitus without complications: Secondary | ICD-10-CM | POA: Diagnosis not present

## 2015-03-01 DIAGNOSIS — M797 Fibromyalgia: Secondary | ICD-10-CM | POA: Insufficient documentation

## 2015-03-01 DIAGNOSIS — Z952 Presence of prosthetic heart valve: Secondary | ICD-10-CM | POA: Diagnosis not present

## 2015-03-01 DIAGNOSIS — Z7901 Long term (current) use of anticoagulants: Secondary | ICD-10-CM | POA: Diagnosis not present

## 2015-03-01 DIAGNOSIS — M47814 Spondylosis without myelopathy or radiculopathy, thoracic region: Secondary | ICD-10-CM | POA: Diagnosis not present

## 2015-03-01 DIAGNOSIS — Z87891 Personal history of nicotine dependence: Secondary | ICD-10-CM | POA: Insufficient documentation

## 2015-03-01 DIAGNOSIS — M4804 Spinal stenosis, thoracic region: Secondary | ICD-10-CM | POA: Diagnosis not present

## 2015-03-01 DIAGNOSIS — M4316 Spondylolisthesis, lumbar region: Secondary | ICD-10-CM | POA: Insufficient documentation

## 2015-03-01 DIAGNOSIS — F1190 Opioid use, unspecified, uncomplicated: Secondary | ICD-10-CM

## 2015-03-01 DIAGNOSIS — F329 Major depressive disorder, single episode, unspecified: Secondary | ICD-10-CM | POA: Diagnosis not present

## 2015-03-01 DIAGNOSIS — I2510 Atherosclerotic heart disease of native coronary artery without angina pectoris: Secondary | ICD-10-CM | POA: Insufficient documentation

## 2015-03-01 DIAGNOSIS — Z5181 Encounter for therapeutic drug level monitoring: Secondary | ICD-10-CM

## 2015-03-01 MED ORDER — FENTANYL 75 MCG/HR TD PT72: 75.0000 ug | MEDICATED_PATCH | TRANSDERMAL | Status: DC

## 2015-03-01 MED ORDER — CYCLOBENZAPRINE HCL 5 MG PO TABS: 5.0000 mg | ORAL_TABLET | Freq: Three times a day (TID) | ORAL | Status: DC | PRN

## 2015-03-01 MED ORDER — NALOXONE HCL 4 MG/0.1ML NA LIQD: 1.0000 | NASAL | Status: AC | PRN

## 2015-03-01 NOTE — Patient Instructions (Signed)
Naloxone nasal spray What is this medicine? NALOXONE (nal OX one) is a narcotic blocker. It is used to treat narcotic drug overdose. This medicine may be used for other purposes; ask your health care provider or pharmacist if you have questions. What should I tell my health care provider before I take this medicine? They need to know if you have any of these conditions: -drug abuse or addiction -heart disease -an unusual or allergic reaction to naloxone, other medicines, foods, dyes, or preservatives -pregnant or trying to get pregnant -breast-feeding How should I use this medicine? This medicine is for use in the nose. Lay the person on their back. Support their neck with your hand and allow the head to tilt back before giving the medicine. The nasal spray should be given into 1 nostril. After giving the medicine, move the person onto their side. Do not remove or test the nasal spray until ready to use. Get emergency medical help right away after giving the first dose of this medicine, even if the person wakes up. You should be familiar with how to recognize the signs and symptoms of a narcotic overdose. If more doses are needed, give the additional dose in the other nostril. Talk to your pediatrician regarding the use of this medicine in children. While this drug may be prescribed for children as young as newborns for selected conditions, precautions do apply. Overdosage: If you think you have taken too much of this medicine contact a poison control center or emergency room at once. NOTE: This medicine is only for you. Do not share this medicine with others. What if I miss a dose? This does not apply. What may interact with this medicine? This medicine is only used during an emergency. No interactions are expected during emergency use. This list may not describe all possible interactions. Give your health care provider a list of all the medicines, herbs, non-prescription drugs, or dietary  supplements you use. Also tell them if you smoke, drink alcohol, or use illegal drugs. Some items may interact with your medicine. What should I watch for while using this medicine? Keep this medicine ready for use in the case of a narcotic overdose. Make sure that you have the phone number of your doctor or health care professional and local hospital ready. You may need to have additional doses of this medicine. Each nasal spray contains a single dose. Some emergencies may require additional doses. After use, bring the treated person to the nearest hospital or call 911. Make sure the treating health care professional knows that the person has received an injection of this medicine. You will receive additional instructions on what to do during and after use of this medicine before an emergency occurs. What side effects may I notice from receiving this medicine? Side effects that you should report to your doctor or health care professional as soon as possible: -allergic reactions like skin rash, itching or hives, swelling of the face, lips, or tongue -breathing problems -fast, irregular heartbeat -high blood pressure -pain that was controlled by narcotic pain medicine -seizures Side effects that usually do not require medical attention (report these to your doctor or health care professional if they continue or are bothersome): -anxious -chills -diarrhea -fever -headache -muscle pain -nausea, vomiting -nose irritation like dryness, congestion, and swelling -sweating This list may not describe all possible side effects. Call your doctor for medical advice about side effects. You may report side effects to FDA at 1-800-FDA-1088. Where should I keep my   medicine? Keep out of the reach of children. Store between 4 and 40 degrees C (39 and 104 degrees F). Do not freeze. Throw away any unused medicine after the expiration date. Keep in original box until ready to use. NOTE: This sheet is a summary.  It may not cover all possible information. If you have questions about this medicine, talk to your doctor, pharmacist, or health care provider.    2016, [employer]/Gold Standard. (2013-12-16 14:24:03)  

## 2015-03-01 NOTE — Progress Notes (Signed)
Patient's Name: Pamela Mcmahon MRN: [id number] DOB: [birth year] DOS: 03/01/2015  Primary Reason(s) for Visit: Encounter for Medication Management CC: Fibromyalgia   HPI  [name] is a 70 y.o. year old, female patient, who returns today as an established patient. She has Diabetes (HCC); Weight loss; Grief; Arteriosclerosis of coronary artery; Chronic kidney disease; Clinical depression; Diabetes mellitus, type 2 (HCC); Fibrositis; Anxiety, generalized; Cerebrovascular accident, old; HLD (hyperlipidemia); Abdominal apron; Abuse, drug or alcohol; Chronic pain; Long term current use of opiate analgesic; Long term prescription opiate use; Opiate use (180 MME/Day); Opiate dependence (HCC); Encounter for therapeutic drug level monitoring; Fibromyalgia; Type 2 diabetes mellitus (HCC); Current use of long term anticoagulation (Plavix); History of thromboembolism; Chronic atrial fibrillation (HCC); History of heart valve replacement with mechanical valve; Chronic upper back pain; Thoracic spondylosis; 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 Her primarily concern today is the Fibromyalgia   The patient returns to the clinic today for pharmacological management of her chronic pain. She indicates that she is doing really good with her current regimen. She does not want to change it. To comply with CVC recommendations, today we have talked to the patient about her medication, her dose, and we have worked with providing her some strategies to  mitigate the risk. She was provided with a prescription for Narcan. Her son was present and we took the opportunity to instruct him on when he needs to use the medicine.  Reported Pain Score: 8  (wearing patch that was applied 3 nights ago), clinically she looks like a 2/10. Reported level is inconsistent with clinical obrservations. Pain Type: Chronic pain Pain Location: Other (Comment) (generalized pain all over) Pain Descriptors / Indicators: Aching, Constant Pain Frequency: Constant  Date of Last Visit: 11/20/14 Service Provided on Last Visit: Med Refill  Controlled Substance Pharmacotherapy  Analgesic: Duragesic 75 mcg/h every 72 hours. MME/day: 180 mg/day Pharmacokinetics: Onset of action (Liberation/Absorption): Within expected pharmacological parameters Time to Peak effect (Distribution): Timing and results are as within normal expected parameters Duration of action (Metabolism/Excretion): Within normal limits for medication Pharmacodynamics: Analgesic Effect: More than 50% Activity Facilitation: Medication(s) allow patient to sit, stand, walk, and do the basic ADLs Perceived Effectiveness: Described as relatively effective, allowing for increase in activities of daily living (ADL) Side-effects or Adverse reactions: None reported Monitoring: [place] PMP: Compliant with practice rules and regulations UDS Results/interpretation: The patient's last UDS was done on 11/20/2014 and it came back within normal limits with no unexpected results. She remains compliant. Medication Assessment Form: Reviewed. Patient indicates being compliant with therapy Treatment compliance: Compliant Risk Assessment: Substance Use Disorder (SUD) Risk Level: Low  Pharmacologic Plan: Continue therapy as is  Lab Work: Illicit Drugs Lab Results  Component Value Date   THCU NEGATIVE 04/06/2013   PCPSCRNUR NEGATIVE 04/06/2013   MDMA NEGATIVE 04/06/2013   AMPHETMU NEGATIVE 04/06/2013   METHADONE NEGATIVE  04/06/2013    Inflammation Markers Lab Results  Component Value Date   ESRSEDRATE 6 10/22/2011    Renal Function Lab Results  Component Value Date   BUN 18 06/20/2014   CREATININE 1.64* 06/20/2014  GFRAA 36* 06/20/2014   GFRNONAA 31* 06/20/2014    Hepatic Function Lab Results  Component Value Date   AST 24 06/20/2014   ALT 11* 06/20/2014   ALBUMIN 3.9 06/20/2014    Electrolytes Lab Results  Component Value Date   NA 138 06/20/2014   K 3.5 06/20/2014   CL 97* 06/20/2014   CALCIUM 9.4 06/20/2014   MG 2.5* 03/04/2012    Allergies  Ms. Steuart is allergic to codeine; codeine sulfate; diazepam; gemfibrozil; lyrica; tricor; and vicodin.  Meds  The patient has a current medication list which includes the following prescription(s): albuterol, alprazolam, amitriptyline, aspirin ec, vitamin d3, clopidogrel, cyclobenzaprine, fentanyl, fentanyl, fentanyl, furosemide, glucose blood, levothyroxine, nitroglycerin, omeprazole, onetouch delica lancets 33g, potassium chloride sa, simvastatin, zolpidem, glyburide, and naloxone hcl.  Current Outpatient Prescriptions on File Prior to Visit  Medication Sig  . albuterol (PROAIR HFA) 108 (90 BASE) MCG/ACT inhaler Inhale 2 puffs into the lungs every 6 (six) hours as needed for wheezing.   Marland Kitchen ALPRAZolam (XANAX) 0.5 MG tablet Take 0.5 mg by mouth 3 (three) times daily as needed for anxiety.  Marland Kitchen amitriptyline (ELAVIL) 100 MG tablet Take 1 tablet (100 mg total) by mouth at bedtime.  Marland Kitchen aspirin EC 81 MG tablet Take 81 mg by mouth daily.  . Cholecalciferol (VITAMIN D3) 2000 UNITS TABS Take 2,000 Int'l Units by mouth daily.  . clopidogrel (PLAVIX) 75 MG tablet Take 75 mg by mouth daily.  . furosemide (LASIX) 40 MG tablet Take 40 mg by mouth daily.  Marland Kitchen glucose blood (ONE TOUCH ULTRA TEST) test strip Use once daily. DX 250.00  . levothyroxine (SYNTHROID, LEVOTHROID) 50 MCG tablet Take 50 mcg by mouth daily before breakfast.  . nitroGLYCERIN  (NITROLINGUAL) 0.4 MG/SPRAY spray Place 1 spray under the tongue every 5 (five) minutes x 3 doses as needed for chest pain.  Marland Kitchen omeprazole (PRILOSEC) 40 MG capsule Take 40 mg by mouth daily.  Letta Pate DELICA LANCETS 33G MISC Use 1 Units once daily.  . potassium chloride SA (K-DUR,KLOR-CON) 20 MEQ tablet Take 10 mEq by mouth 2 (two) times daily. Take 1/2 tablet in the morning and 1/2 tablet in the evening.  . simvastatin (ZOCOR) 40 MG tablet Take 40 mg by mouth at bedtime.  Marland Kitchen zolpidem (AMBIEN) 10 MG tablet Take 10 mg by mouth at bedtime.  Marland Kitchen glyBURIDE (DIABETA) 5 MG tablet Take 10 mg by mouth daily with breakfast.    No current facility-administered medications on file prior to visit.    ROS  Constitutional: Afebrile, no chills, well hydrated and well nourished Gastrointestinal: negative Musculoskeletal:negative Neurological: negative Behavioral/Psych: negative  PFSH  Medical:  Ms. Pugmire  has a past medical history of Diabetes mellitus without complication (HCC); Stroke [facility]); Neuropathy (HCC); Fibromyalgia; Chronic hip pain; History of thromboembolism (11/20/2014); and History of heart valve replacement with mechanical valve (11/20/2014). Family: family history is not on file. Surgical:  has past surgical history that includes Cholecystectomy; Abdominal hysterectomy; and Cardiac surgery. Tobacco:  reports that she quit smoking about a year ago. Her smoking use included Cigarettes. She does not have any smokeless tobacco history on file. Alcohol:  reports that she does not drink alcohol. Drug:  reports that she does not use illicit drugs.  Physical Exam  Vitals:  Today's Vitals   03/01/15 1305  BP: 104/91  Pulse: 78  Temp: 97.9 F (36.6 C)  TempSrc: Oral  Resp: 16  Height: 4\' 11"  (1.499 m)  Weight: 130 lb (58.968 kg)  SpO2: 99%  PainSc: 8     Calculated BMI: Body mass index is 26.24 kg/(m^2).  General appearance: alert, cooperative, appears stated age, no distress and mildly  obese Eyes: PERLA Respiratory: No evidence respiratory distress, no audible rales or ronchi and no use of accessory muscles of respiration  Cervical Spine Inspection: Normal anatomy Alignment: Symetrical ROM: Adequate  Upper Extremities Inspection: No gross anomalies detected ROM: Adequate Sensory: Normal Motor: Unremarkable  Thoracic Spine Inspection: No gross anomalies detected Alignment: Symetrical ROM: Adequate  Lumbar Spine Inspection: No gross anomalies detected Alignment: Symetrical ROM: Adequate  Gait: WNL  Lower Extremities Inspection: No gross anomalies detected ROM: Adequate Sensory:  Normal Motor: Unremarkable  Assessment & Plan  Primary Diagnosis & Pertinent Problem List: The primary encounter diagnosis was Chronic pain. Diagnoses of Encounter for therapeutic drug level monitoring, Long term current use of opiate analgesic, and Opiate use (180 MME/Day) were also pertinent to this visit.  Visit Diagnosis: 1. Chronic pain   2. Encounter for therapeutic drug level monitoring   3. Long term current use of opiate analgesic   4. Opiate use (180 MME/Day)     Problem-specific Plan(s): No problem-specific assessment & plan notes found for this encounter.   Plan of Care  Pharmacotherapy (Medications Ordered): Meds ordered this encounter  Medications  . cyclobenzaprine (FLEXERIL) 5 MG tablet    Sig: Take 1 tablet (5 mg total) by mouth 3 (three) times daily as needed for muscle spasms.    Dispense:  90 tablet    Refill:  2    Do not place this medication, or any other prescription from our practice, on "Automatic Refill". Patient may have prescription filled one day early if pharmacy is closed on scheduled refill date.  . fentaNYL (DURAGESIC - DOSED MCG/HR) 75 MCG/HR    Sig: Place 1 patch (75 mcg total) onto the skin every 3 (three) days.    Dispense:  10 patch    Refill:  0    Do not place this medication, or any other prescription from our practice, on  "Automatic Refill". Patient may have prescription filled one day early if pharmacy is closed on scheduled refill date. Do not fill until: 03/12/15 To last until: 04/11/15  . fentaNYL (DURAGESIC - DOSED MCG/HR) 75 MCG/HR    Sig: Place 1 patch (75 mcg total) onto the skin every 3 (three) days.    Dispense:  10 patch    Refill:  0    Do not place this medication, or any other prescription from our practice, on "Automatic Refill". Patient may have prescription filled one day early if pharmacy is closed on scheduled refill date. Do not fill until: 04/11/15 To last until: 05/11/15  . fentaNYL (DURAGESIC - DOSED MCG/HR) 75 MCG/HR    Sig: Place 1 patch (75 mcg total) onto the skin every 3 (three) days.    Dispense:  10 patch    Refill:  0    Do not place this medication, or any other prescription from our practice, on "Automatic Refill". Patient may have prescription filled one day early if pharmacy is closed on scheduled refill date. Do not fill until: 05/11/15 To last until: 06/10/15  . Naloxone HCl (NARCAN) 4 MG/0.1ML LIQD    Sig: Place 1 spray into the nose every 5 (five) minutes as needed. Spray half of the content into each nostril. Call 911.    Dispense:  2 each    Refill:  PRN    Do not place this  medication, or any other prescription from our practice, on "Automatic Refill". Patient may have prescription filled one day early if pharmacy is closed on scheduled refill date.    Lab-work & Procedure Ordered: No orders of the defined types were placed in this encounter.    Imaging Ordered: None  Interventional Therapies: Scheduled: None at this time. PRN Procedures: None at this time.    Referral(s) or Consult(s): None at this time.  Medications administered during this visit: Ms. Eagen had no medications administered during this visit.  Future Appointments Date Time Provider Department Center  05/28/2015 1:00 PM Delano Metz, MD [facility] None    Primary Care Physician:  Rafael Bihari, MD Location: [facility] Note by: Sydnee Levans Laban Emperor, M.D, DABA, DABAPM, DABPM, DABIPP, FIPP

## 2015-03-01 NOTE — Progress Notes (Signed)
Here for medication management.

## 2015-03-09 LAB — TOXASSURE SELECT 13 (MW), URINE: PDF: 0

## 2015-03-29 NOTE — Discharge Instructions (Signed)

## 2015-03-30 ENCOUNTER — Encounter: Payer: Self-pay | Admitting: *Deleted

## 2015-04-02 ENCOUNTER — Encounter
Admission: RE | Disposition: A | Discharge status: Home / Self Care | Payer: Self-pay | Source: Ambulatory Visit | Attending: Ophthalmology

## 2015-04-02 ENCOUNTER — Ambulatory Visit: Payer: Medicare Other | Admitting: Anesthesiology

## 2015-04-02 ENCOUNTER — Ambulatory Visit
Admission: RE | Admit: 2015-04-02 | Discharge: 2015-04-02 | Disposition: A | Discharge status: Home / Self Care | Payer: Medicare Other | Source: Ambulatory Visit | Attending: Ophthalmology | Admitting: Ophthalmology

## 2015-04-02 DIAGNOSIS — Z96653 Presence of artificial knee joint, bilateral: Secondary | ICD-10-CM | POA: Insufficient documentation

## 2015-04-02 DIAGNOSIS — H2511 Age-related nuclear cataract, right eye: Secondary | ICD-10-CM | POA: Diagnosis present

## 2015-04-02 DIAGNOSIS — Z951 Presence of aortocoronary bypass graft: Secondary | ICD-10-CM | POA: Insufficient documentation

## 2015-04-02 DIAGNOSIS — I110 Hypertensive heart disease with heart failure: Secondary | ICD-10-CM | POA: Insufficient documentation

## 2015-04-02 DIAGNOSIS — I252 Old myocardial infarction: Secondary | ICD-10-CM | POA: Diagnosis not present

## 2015-04-02 DIAGNOSIS — R062 Wheezing: Secondary | ICD-10-CM | POA: Insufficient documentation

## 2015-04-02 DIAGNOSIS — F329 Major depressive disorder, single episode, unspecified: Secondary | ICD-10-CM | POA: Insufficient documentation

## 2015-04-02 DIAGNOSIS — I2510 Atherosclerotic heart disease of native coronary artery without angina pectoris: Secondary | ICD-10-CM | POA: Insufficient documentation

## 2015-04-02 DIAGNOSIS — K5790 Diverticulosis of intestine, part unspecified, without perforation or abscess without bleeding: Secondary | ICD-10-CM | POA: Insufficient documentation

## 2015-04-02 DIAGNOSIS — H9190 Unspecified hearing loss, unspecified ear: Secondary | ICD-10-CM | POA: Diagnosis not present

## 2015-04-02 DIAGNOSIS — M719 Bursopathy, unspecified: Secondary | ICD-10-CM | POA: Diagnosis not present

## 2015-04-02 DIAGNOSIS — Z90710 Acquired absence of both cervix and uterus: Secondary | ICD-10-CM | POA: Insufficient documentation

## 2015-04-02 DIAGNOSIS — Z955 Presence of coronary angioplasty implant and graft: Secondary | ICD-10-CM | POA: Insufficient documentation

## 2015-04-02 DIAGNOSIS — Z8673 Personal history of transient ischemic attack (TIA), and cerebral infarction without residual deficits: Secondary | ICD-10-CM | POA: Diagnosis not present

## 2015-04-02 DIAGNOSIS — I509 Heart failure, unspecified: Secondary | ICD-10-CM | POA: Diagnosis not present

## 2015-04-02 DIAGNOSIS — Z9049 Acquired absence of other specified parts of digestive tract: Secondary | ICD-10-CM | POA: Insufficient documentation

## 2015-04-02 DIAGNOSIS — Z888 Allergy status to other drugs, medicaments and biological substances status: Secondary | ICD-10-CM | POA: Insufficient documentation

## 2015-04-02 DIAGNOSIS — M7989 Other specified soft tissue disorders: Secondary | ICD-10-CM | POA: Diagnosis not present

## 2015-04-02 DIAGNOSIS — K219 Gastro-esophageal reflux disease without esophagitis: Secondary | ICD-10-CM | POA: Diagnosis not present

## 2015-04-02 DIAGNOSIS — E7800 Pure hypercholesterolemia, unspecified: Secondary | ICD-10-CM | POA: Insufficient documentation

## 2015-04-02 DIAGNOSIS — F17200 Nicotine dependence, unspecified, uncomplicated: Secondary | ICD-10-CM | POA: Insufficient documentation

## 2015-04-02 DIAGNOSIS — Z885 Allergy status to narcotic agent status: Secondary | ICD-10-CM | POA: Insufficient documentation

## 2015-04-02 DIAGNOSIS — E119 Type 2 diabetes mellitus without complications: Secondary | ICD-10-CM | POA: Diagnosis not present

## 2015-04-02 HISTORY — DX: Hypothyroidism, unspecified: E03.9

## 2015-04-02 HISTORY — DX: Gastro-esophageal reflux disease without esophagitis: K21.9

## 2015-04-02 HISTORY — DX: Reserved for inherently not codable concepts without codable children: IMO0001

## 2015-04-02 HISTORY — DX: Acute myocardial infarction, unspecified: I21.9

## 2015-04-02 HISTORY — PX: CATARACT EXTRACTION W/PHACO: SHX586

## 2015-04-02 HISTORY — DX: Dizziness and giddiness: R42

## 2015-04-02 HISTORY — DX: Unspecified convulsions: R56.9

## 2015-04-02 LAB — GLUCOSE, CAPILLARY
Glucose-Capillary: 106 mg/dL — ABNORMAL HIGH (ref 65–99)
Glucose-Capillary: 103 mg/dL — ABNORMAL HIGH (ref 65–99)

## 2015-04-02 SURGERY — PHACOEMULSIFICATION, CATARACT, WITH IOL INSERTION
Anesthesia: Monitor Anesthesia Care | Laterality: Right | Wound class: Clean

## 2015-04-02 MED ORDER — LIDOCAINE HCL (PF) 4 % IJ SOLN
INTRAMUSCULAR | Status: DC | PRN
  Administered 2015-04-02: 4 mL via OPHTHALMIC

## 2015-04-02 MED ORDER — ARMC OPHTHALMIC DILATING GEL
1.0000 "application " | OPHTHALMIC | Status: DC | PRN
  Administered 2015-04-02 (×2): 1 via OPHTHALMIC

## 2015-04-02 MED ORDER — BRIMONIDINE TARTRATE 0.2 % OP SOLN
OPHTHALMIC | Status: DC | PRN
  Administered 2015-04-02: 1 [drp] via OPHTHALMIC

## 2015-04-02 MED ORDER — ACETAMINOPHEN 325 MG PO TABS: 325.0000 mg | ORAL_TABLET | ORAL | Status: DC | PRN

## 2015-04-02 MED ORDER — MIDAZOLAM HCL 2 MG/2ML IJ SOLN
INTRAMUSCULAR | Status: DC | PRN
  Administered 2015-04-02: 2 mg via INTRAVENOUS

## 2015-04-02 MED ORDER — TIMOLOL MALEATE 0.5 % OP SOLN
OPHTHALMIC | Status: DC | PRN
  Administered 2015-04-02: 1 [drp] via OPHTHALMIC

## 2015-04-02 MED ORDER — ACETAMINOPHEN 160 MG/5ML PO SOLN: 325.0000 mg | ORAL | Status: DC | PRN

## 2015-04-02 MED ORDER — POVIDONE-IODINE 5 % OP SOLN
1.0000 "application " | OPHTHALMIC | Status: DC | PRN
  Administered 2015-04-02: 1 via OPHTHALMIC

## 2015-04-02 MED ORDER — TETRACAINE HCL 0.5 % OP SOLN
1.0000 [drp] | OPHTHALMIC | Status: DC | PRN
  Administered 2015-04-02: 1 [drp] via OPHTHALMIC

## 2015-04-02 MED ORDER — EPINEPHRINE HCL 1 MG/ML IJ SOLN
INTRAOCULAR | Status: DC | PRN
  Administered 2015-04-02: 111 mL via OPHTHALMIC

## 2015-04-02 MED ORDER — FENTANYL CITRATE (PF) 100 MCG/2ML IJ SOLN
INTRAMUSCULAR | Status: DC | PRN
  Administered 2015-04-02: 50 ug via INTRAVENOUS

## 2015-04-02 MED ORDER — NA HYALUR & NA CHOND-NA HYALUR 0.4-0.35 ML IO KIT
PACK | INTRAOCULAR | Status: DC | PRN
  Administered 2015-04-02: 1 mL via INTRAOCULAR

## 2015-04-02 MED ORDER — LACTATED RINGERS IV SOLN
INTRAVENOUS | Status: DC
Start: 2015-04-02 — End: 2015-04-02

## 2015-04-02 MED ORDER — CEFUROXIME OPHTHALMIC INJECTION 1 MG/0.1 ML
INJECTION | OPHTHALMIC | Status: DC | PRN
  Administered 2015-04-02: 0.1 mL via INTRACAMERAL

## 2015-04-02 SURGICAL SUPPLY — 29 items
APPLICATOR COTTON TIP 3IN (MISCELLANEOUS) ×3 IMPLANT
CANNULA ANT/CHMB 27GA (MISCELLANEOUS) ×3 IMPLANT
DISSECTOR HYDRO NUCLEUS 50X22 (MISCELLANEOUS) ×3 IMPLANT
GLOVE BIO SURGEON STRL SZ7 (GLOVE) ×3 IMPLANT
GLOVE SURG LX 6.5 MICRO (GLOVE) ×2
GLOVE SURG LX STRL 6.5 MICRO (GLOVE) ×1 IMPLANT
GOWN STRL REUS W/ TWL LRG LVL3 (GOWN DISPOSABLE) ×2 IMPLANT
GOWN STRL REUS W/TWL LRG LVL3 (GOWN DISPOSABLE) ×4
LENS IOL ACRSF IQ ULTRA 23.5 (Intraocular Lens) ×1 IMPLANT
LENS IOL ACRYSOF IQ 23.5 (Intraocular Lens) ×3 IMPLANT
MARKER SKIN DUAL TIP RULER LAB (MISCELLANEOUS) ×3 IMPLANT
NEEDLE FILTER BLUNT 18X 1/2SAF (NEEDLE) ×2
NEEDLE FILTER BLUNT 18X1 1/2 (NEEDLE) ×1 IMPLANT
PACK CATARACT BRASINGTON (MISCELLANEOUS) ×3 IMPLANT
PACK EYE AFTER SURG (MISCELLANEOUS) ×3 IMPLANT
PACK OPTHALMIC (MISCELLANEOUS) ×3 IMPLANT
RING MALYGIN 7.0 (MISCELLANEOUS) IMPLANT
SOL BAL SALT 15ML (MISCELLANEOUS)
SOLUTION BAL SALT 15ML (MISCELLANEOUS) IMPLANT
SUT ETHILON 10-0 CS-B-6CS-B-6 (SUTURE)
SUT VICRYL  9 0 (SUTURE)
SUT VICRYL 9 0 (SUTURE) IMPLANT
SUTURE EHLN 10-0 CS-B-6CS-B-6 (SUTURE) IMPLANT
SYR 3ML LL SCALE MARK (SYRINGE) ×3 IMPLANT
SYR TB 1ML LUER SLIP (SYRINGE) ×3 IMPLANT
WATER STERILE IRR 250ML POUR (IV SOLUTION) ×3 IMPLANT
WATER STERILE IRR 500ML POUR (IV SOLUTION) IMPLANT
WICK EYE OCUCEL (MISCELLANEOUS) IMPLANT
WIPE NON LINTING 3.25X3.25 (MISCELLANEOUS) ×3 IMPLANT

## 2015-04-02 NOTE — Transfer of Care (Signed)
Immediate Anesthesia Transfer of Care Note  Patient: Pamela Mcmahon  Procedure(s) Performed: Procedure(s) with comments: CATARACT EXTRACTION PHACO AND INTRAOCULAR LENS PLACEMENT (IOC) (Right) - DIABETIC - oral meds  Patient Location: PACU  Anesthesia Type: General, MAC  Level of Consciousness: awake, alert  and patient cooperative  Airway and Oxygen Therapy: Patient Spontanous Breathing and Patient connected to supplemental oxygen  Post-op Assessment: Post-op Vital signs reviewed, Patient's Cardiovascular Status Stable, Respiratory Function Stable, Patent Airway and No signs of Nausea or vomiting  Post-op Vital Signs: Reviewed and stable  Complications: No apparent anesthesia complications

## 2015-04-02 NOTE — Anesthesia Procedure Notes (Signed)
Procedure Name: MAC Performed by: Velma Agnes Pre-anesthesia Checklist: Patient identified, Emergency Drugs available, Suction available, Timeout performed and Patient being monitored Patient Re-evaluated:Patient Re-evaluated prior to inductionOxygen Delivery Method: Nasal cannula Placement Confirmation: positive ETCO2       

## 2015-04-02 NOTE — Op Note (Signed)
Date of Surgery: 04/02/2015  PREOPERATIVE DIAGNOSES: Visually significant nuclear sclerotic cataract, right eye.  POSTOPERATIVE DIAGNOSES: Same  PROCEDURES PERFORMED: Cataract extraction with intraocular lens implant, right eye.  SURGEON: Devin GoingAnita P. Vin, M.D.  ANESTHESIA: MAC and topical  IMPLANTS: Alcon UltraJect +23.5 D   Implant Name Type Inv. Item Serial No. Manufacturer Lot No. LRB No. Used  aspheric iol ultrasert pre loaded     [id number] ALCON   Right 1     COMPLICATIONS: None.  DESCRIPTION OF PROCEDURE: Therapeutic options were discussed with the patient preoperatively, including a discussion of risks and benefits of surgery. Informed consent was obtained. An IOL-Master and immersion biometry were used to take the lens measurements, and a dilated fundus exam was performed within 6 months of the surgical date.  The patient was premedicated and brought to the operating room and placed on the operating table in the supine position. After adequate anesthesia, the patient was prepped and draped in the usual sterile ophthalmic fashion. A wire lid speculum was inserted and the microscope was positioned. A Superblade was used to create a paracentesis site at the limbus and a small amount of dilute preservative free lidocaine was instilled into the anterior chamber, followed by dispersive viscoelastic. A clear corneal incision was created temporally using a 2.4 mm keratome blade. Capsulorrhexis was then performed. In situ phacoemulsification was performed.  Cortical material was removed with the irrigation-aspiration unit. Dispersive viscoelastic was instilled to open the capsular bag. A posterior chamber intraocular lens with the specifications above was inserted and positioned. Irrigation-aspiration was used to remove all viscoelastic. Cefuroxime 1cc was instilled into the anterior chamber, and the corneal incision was checked and found to be water tight. The eyelid speculum was removed.   The operative eye was covered with protective goggles after instilling 1 drop of timolol and brimonidine. The patient tolerated the procedure well. There were no complications.

## 2015-04-02 NOTE — Anesthesia Preprocedure Evaluation (Signed)
Anesthesia Evaluation  Patient identified by MRN, date of birth, ID band Patient awake    Reviewed: Allergy & Precautions, H&P , NPO status , Patient's Chart, lab work & pertinent test results, reviewed documented beta blocker date and time   Airway Mallampati: III  TM Distance: >3 FB Neck ROM: full    Dental no notable dental hx.    Pulmonary Current Smoker,    Pulmonary exam normal breath sounds clear to auscultation       Cardiovascular Exercise Tolerance: Good + CAD and + Past MI  Normal cardiovascular exam Rhythm:regular Rate:Normal     Neuro/Psych negative neurological ROS  negative psych ROS   GI/Hepatic negative GI ROS, Neg liver ROS, GERD  Controlled,  Endo/Other  negative endocrine ROSdiabetesHypothyroidism   Renal/GU negative Renal ROS  negative genitourinary   Musculoskeletal   Abdominal   Peds  Hematology negative hematology ROS (+)   Anesthesia Other Findings   Reproductive/Obstetrics negative OB ROS                             Anesthesia Physical Anesthesia Plan  ASA: III  Anesthesia Plan: General and MAC   Post-op Pain Management:    Induction: Intravenous  Airway Management Planned: Nasal Cannula  Additional Equipment:   Intra-op Plan:   Post-operative Plan:   Informed Consent: I have reviewed the patients History and Physical, chart, labs and discussed the procedure including the risks, benefits and alternatives for the proposed anesthesia with the patient or authorized representative who has indicated his/her understanding and acceptance.   Dental Advisory Given  Plan Discussed with: CRNA  Anesthesia Plan Comments:         Anesthesia Quick Evaluation

## 2015-04-02 NOTE — Anesthesia Postprocedure Evaluation (Signed)
Anesthesia Post Note  Patient: Pamela Mcmahon  Procedure(s) Performed: Procedure(s) (LRB): CATARACT EXTRACTION PHACO AND INTRAOCULAR LENS PLACEMENT (IOC) (Right)  Patient location during evaluation: PACU Anesthesia Type: MAC Level of consciousness: awake and alert Pain management: pain level controlled Vital Signs Assessment: post-procedure vital signs reviewed and stable Respiratory status: spontaneous breathing, nonlabored ventilation and respiratory function stable Cardiovascular status: stable and blood pressure returned to baseline Anesthetic complications: no    Alta CorningBacon, Derotha Fishbaugh S

## 2015-04-03 ENCOUNTER — Encounter: Payer: Self-pay | Admitting: Ophthalmology

## 2015-04-05 ENCOUNTER — Telehealth: Payer: Self-pay | Admitting: Pain Medicine

## 2015-04-05 NOTE — Telephone Encounter (Signed)
Pharmacy called with instructions for naloxone as per Dr Laban EmperorNaveira, left voicemail and patient aware.

## 2015-04-05 NOTE — Telephone Encounter (Signed)
Called to [facility] to confirm Rx for Naloxone spray with sig.  Called patient to let her know that I have called pharmacy with Rx and instructions as written per Dr Laban EmperorNaveira and that this information was left in the form of a voicemail and to please call our office if they had additional questions.  Patient will f/up with pharmacy today.

## 2015-04-05 NOTE — Telephone Encounter (Signed)
Wants to be able to pick up spray for accidental overdose of meds at her pharmacy and says pharmacy wants office to call them so she can pick up

## 2015-05-07 ENCOUNTER — Encounter: Payer: Self-pay | Admitting: *Deleted

## 2015-05-11 NOTE — Discharge Instructions (Signed)

## 2015-05-14 ENCOUNTER — Ambulatory Visit: Payer: Medicare Other | Admitting: Anesthesiology

## 2015-05-14 ENCOUNTER — Encounter
Admission: RE | Disposition: A | Discharge status: Home / Self Care | Payer: Self-pay | Source: Ambulatory Visit | Attending: Ophthalmology

## 2015-05-14 ENCOUNTER — Ambulatory Visit
Admission: RE | Admit: 2015-05-14 | Discharge: 2015-05-14 | Disposition: A | Discharge status: Home / Self Care | Payer: Medicare Other | Source: Ambulatory Visit | Attending: Ophthalmology | Admitting: Ophthalmology

## 2015-05-14 DIAGNOSIS — F17200 Nicotine dependence, unspecified, uncomplicated: Secondary | ICD-10-CM | POA: Insufficient documentation

## 2015-05-14 DIAGNOSIS — I252 Old myocardial infarction: Secondary | ICD-10-CM | POA: Insufficient documentation

## 2015-05-14 DIAGNOSIS — Z96653 Presence of artificial knee joint, bilateral: Secondary | ICD-10-CM | POA: Diagnosis not present

## 2015-05-14 DIAGNOSIS — M7989 Other specified soft tissue disorders: Secondary | ICD-10-CM | POA: Insufficient documentation

## 2015-05-14 DIAGNOSIS — Z888 Allergy status to other drugs, medicaments and biological substances status: Secondary | ICD-10-CM | POA: Insufficient documentation

## 2015-05-14 DIAGNOSIS — Z8673 Personal history of transient ischemic attack (TIA), and cerebral infarction without residual deficits: Secondary | ICD-10-CM | POA: Insufficient documentation

## 2015-05-14 DIAGNOSIS — F329 Major depressive disorder, single episode, unspecified: Secondary | ICD-10-CM | POA: Diagnosis not present

## 2015-05-14 DIAGNOSIS — H9190 Unspecified hearing loss, unspecified ear: Secondary | ICD-10-CM | POA: Diagnosis not present

## 2015-05-14 DIAGNOSIS — K219 Gastro-esophageal reflux disease without esophagitis: Secondary | ICD-10-CM | POA: Diagnosis not present

## 2015-05-14 DIAGNOSIS — R062 Wheezing: Secondary | ICD-10-CM | POA: Diagnosis not present

## 2015-05-14 DIAGNOSIS — Z90710 Acquired absence of both cervix and uterus: Secondary | ICD-10-CM | POA: Diagnosis not present

## 2015-05-14 DIAGNOSIS — Z885 Allergy status to narcotic agent status: Secondary | ICD-10-CM | POA: Diagnosis not present

## 2015-05-14 DIAGNOSIS — Z955 Presence of coronary angioplasty implant and graft: Secondary | ICD-10-CM | POA: Diagnosis not present

## 2015-05-14 DIAGNOSIS — Z9049 Acquired absence of other specified parts of digestive tract: Secondary | ICD-10-CM | POA: Diagnosis not present

## 2015-05-14 DIAGNOSIS — M719 Bursopathy, unspecified: Secondary | ICD-10-CM | POA: Insufficient documentation

## 2015-05-14 DIAGNOSIS — R55 Syncope and collapse: Secondary | ICD-10-CM | POA: Diagnosis not present

## 2015-05-14 DIAGNOSIS — E119 Type 2 diabetes mellitus without complications: Secondary | ICD-10-CM | POA: Insufficient documentation

## 2015-05-14 DIAGNOSIS — K5790 Diverticulosis of intestine, part unspecified, without perforation or abscess without bleeding: Secondary | ICD-10-CM | POA: Insufficient documentation

## 2015-05-14 DIAGNOSIS — H2512 Age-related nuclear cataract, left eye: Secondary | ICD-10-CM | POA: Insufficient documentation

## 2015-05-14 DIAGNOSIS — I25119 Atherosclerotic heart disease of native coronary artery with unspecified angina pectoris: Secondary | ICD-10-CM | POA: Diagnosis not present

## 2015-05-14 DIAGNOSIS — N289 Disorder of kidney and ureter, unspecified: Secondary | ICD-10-CM | POA: Insufficient documentation

## 2015-05-14 DIAGNOSIS — I10 Essential (primary) hypertension: Secondary | ICD-10-CM | POA: Insufficient documentation

## 2015-05-14 DIAGNOSIS — E7800 Pure hypercholesterolemia, unspecified: Secondary | ICD-10-CM | POA: Diagnosis not present

## 2015-05-14 HISTORY — PX: CATARACT EXTRACTION W/PHACO: SHX586

## 2015-05-14 LAB — GLUCOSE, CAPILLARY
GLUCOSE-CAPILLARY: 92 mg/dL (ref 65–99)
Glucose-Capillary: 59 mg/dL — ABNORMAL LOW (ref 65–99)

## 2015-05-14 SURGERY — PHACOEMULSIFICATION, CATARACT, WITH IOL INSERTION
Anesthesia: Monitor Anesthesia Care | Laterality: Left | Wound class: Clean

## 2015-05-14 MED ORDER — ARMC OPHTHALMIC DILATING GEL
1.0000 "application " | OPHTHALMIC | Status: DC | PRN
  Administered 2015-05-14 (×2): 1 via OPHTHALMIC

## 2015-05-14 MED ORDER — CEFUROXIME OPHTHALMIC INJECTION 1 MG/0.1 ML
INJECTION | OPHTHALMIC | Status: DC | PRN
  Administered 2015-05-14: 0.1 mL via INTRACAMERAL

## 2015-05-14 MED ORDER — ACETAMINOPHEN 160 MG/5ML PO SOLN: 325.0000 mg | ORAL | Status: DC | PRN

## 2015-05-14 MED ORDER — BSS IO SOLN
INTRAOCULAR | Status: DC | PRN
  Administered 2015-05-14: 80 mL

## 2015-05-14 MED ORDER — ACETAMINOPHEN 325 MG PO TABS: 325.0000 mg | ORAL_TABLET | ORAL | Status: DC | PRN

## 2015-05-14 MED ORDER — TETRACAINE HCL 0.5 % OP SOLN
1.0000 [drp] | OPHTHALMIC | Status: DC | PRN
  Administered 2015-05-14: 1 [drp] via OPHTHALMIC

## 2015-05-14 MED ORDER — MIDAZOLAM HCL 2 MG/2ML IJ SOLN
INTRAMUSCULAR | Status: DC | PRN
  Administered 2015-05-14: 2 mg via INTRAVENOUS

## 2015-05-14 MED ORDER — POVIDONE-IODINE 5 % OP SOLN
1.0000 "application " | OPHTHALMIC | Status: DC | PRN
  Administered 2015-05-14: 1 via OPHTHALMIC

## 2015-05-14 MED ORDER — FENTANYL CITRATE (PF) 100 MCG/2ML IJ SOLN
INTRAMUSCULAR | Status: DC | PRN
  Administered 2015-05-14: 100 ug via INTRAVENOUS

## 2015-05-14 MED ORDER — NA HYALUR & NA CHOND-NA HYALUR 0.4-0.35 ML IO KIT
PACK | INTRAOCULAR | Status: DC | PRN
  Administered 2015-05-14: 1 mL via INTRAOCULAR

## 2015-05-14 MED ORDER — TIMOLOL MALEATE 0.5 % OP SOLN
OPHTHALMIC | Status: DC | PRN
  Administered 2015-05-14: 1 [drp] via OPHTHALMIC

## 2015-05-14 MED ORDER — BRIMONIDINE TARTRATE 0.2 % OP SOLN
OPHTHALMIC | Status: DC | PRN
  Administered 2015-05-14: 1 [drp] via OPHTHALMIC

## 2015-05-14 MED ORDER — LACTATED RINGERS IV SOLN: INTRAVENOUS | Status: DC

## 2015-05-14 MED ORDER — LIDOCAINE HCL (PF) 4 % IJ SOLN
INTRAOCULAR | Status: DC | PRN
  Administered 2015-05-14: 1 mL via OPHTHALMIC

## 2015-05-14 SURGICAL SUPPLY — 29 items
APPLICATOR COTTON TIP 3IN (MISCELLANEOUS) ×3 IMPLANT
CANNULA ANT/CHMB 27GA (MISCELLANEOUS) ×3 IMPLANT
DISSECTOR HYDRO NUCLEUS 50X22 (MISCELLANEOUS) ×3 IMPLANT
GLOVE BIO SURGEON STRL SZ7 (GLOVE) ×3 IMPLANT
GLOVE SURG LX 6.5 MICRO (GLOVE) ×2
GLOVE SURG LX STRL 6.5 MICRO (GLOVE) ×1 IMPLANT
GOWN STRL REUS W/ TWL LRG LVL3 (GOWN DISPOSABLE) ×2 IMPLANT
GOWN STRL REUS W/TWL LRG LVL3 (GOWN DISPOSABLE) ×4
LENS IOL ACRSF IQ ULTRA 23.5 (Intraocular Lens) ×1 IMPLANT
LENS IOL ACRYSOF IQ 23.5 (Intraocular Lens) ×3 IMPLANT
MARKER SKIN DUAL TIP RULER LAB (MISCELLANEOUS) ×3 IMPLANT
NEEDLE FILTER BLUNT 18X 1/2SAF (NEEDLE) ×2
NEEDLE FILTER BLUNT 18X1 1/2 (NEEDLE) ×1 IMPLANT
PACK CATARACT BRASINGTON (MISCELLANEOUS) ×3 IMPLANT
PACK EYE AFTER SURG (MISCELLANEOUS) ×3 IMPLANT
PACK OPTHALMIC (MISCELLANEOUS) ×3 IMPLANT
RING MALYGIN 7.0 (MISCELLANEOUS) IMPLANT
SOL BAL SALT 15ML (MISCELLANEOUS)
SOLUTION BAL SALT 15ML (MISCELLANEOUS) IMPLANT
SUT ETHILON 10-0 CS-B-6CS-B-6 (SUTURE)
SUT VICRYL  9 0 (SUTURE)
SUT VICRYL 9 0 (SUTURE) IMPLANT
SUTURE EHLN 10-0 CS-B-6CS-B-6 (SUTURE) IMPLANT
SYR 3ML LL SCALE MARK (SYRINGE) ×3 IMPLANT
SYR TB 1ML LUER SLIP (SYRINGE) ×3 IMPLANT
WATER STERILE IRR 250ML POUR (IV SOLUTION) ×3 IMPLANT
WATER STERILE IRR 500ML POUR (IV SOLUTION) IMPLANT
WICK EYE OCUCEL (MISCELLANEOUS) IMPLANT
WIPE NON LINTING 3.25X3.25 (MISCELLANEOUS) ×3 IMPLANT

## 2015-05-14 NOTE — H&P (Signed)
H+P reviewed and is up to date, please see paper chart.  

## 2015-05-14 NOTE — Anesthesia Postprocedure Evaluation (Signed)
Anesthesia Post Note  Patient: Pamela Mcmahon  Procedure(s) Performed: Procedure(s) (LRB): CATARACT EXTRACTION PHACO AND INTRAOCULAR LENS PLACEMENT (IOC) (Left)  Patient location during evaluation: PACU Anesthesia Type: MAC Level of consciousness: awake and alert and oriented Pain management: satisfactory to patient Vital Signs Assessment: post-procedure vital signs reviewed and stable Respiratory status: spontaneous breathing, nonlabored ventilation and respiratory function stable Cardiovascular status: blood pressure returned to baseline and stable Postop Assessment: Adequate PO intake and No signs of nausea or vomiting Anesthetic complications: no    Cherly BeachStella, Maggy Wyble J

## 2015-05-14 NOTE — Op Note (Signed)
Date of Surgery: 05/14/2015  PREOPERATIVE DIAGNOSES: Visually significant nuclear sclerotic cataract, left eye.  POSTOPERATIVE DIAGNOSES: Same  PROCEDURES PERFORMED: Cataract extraction with intraocular lens implant, left eye.  SURGEON: Devin GoingAnita P. Vin, M.D.  ANESTHESIA: MAC and topical  IMPLANTS: [id number] +23.5 D   Implant Name Type Inv. Item Serial No. Manufacturer Lot No. LRB No. Used  Acrysof IQ iol Intraocular Lens   [id number] ALCON   Left 1    COMPLICATIONS: None.  DESCRIPTION OF PROCEDURE: Therapeutic options were discussed with the patient preoperatively, including a discussion of risks and benefits of surgery. Informed consent was obtained. An IOL-Master and immersion biometry were used to take the lens measurements, and a dilated fundus exam was performed within 6 months of the surgical date.  The patient was premedicated and brought to the operating room and placed on the operating table in the supine position. After adequate anesthesia, the patient was prepped and draped in the usual sterile ophthalmic fashion. A wire lid speculum was inserted and the microscope was positioned. A Superblade was used to create a paracentesis site at the limbus and a small amount of dilute preservative free lidocaine was instilled into the anterior chamber, followed by dispersive viscoelastic. A clear corneal incision was created temporally using a 2.4 mm keratome blade. Capsulorrhexis was then performed. In situ phacoemulsification was performed.  Cortical material was removed with the irrigation-aspiration unit. Dispersive viscoelastic was instilled to open the capsular bag. A posterior chamber intraocular lens with the specifications above was inserted and positioned. Irrigation-aspiration was used to remove all viscoelastic. Cefuroxime 1cc was instilled into the anterior chamber, and the corneal incision was checked and found to be water tight. The eyelid speculum was removed.  The operative eye was  covered with protective goggles after instilling 1 drop of timolol and brimonidine. The patient tolerated the procedure well. There were no complications.

## 2015-05-14 NOTE — Transfer of Care (Signed)
Immediate Anesthesia Transfer of Care Note  Patient: Pamela Mcmahon  Procedure(s) Performed: Procedure(s) with comments: CATARACT EXTRACTION PHACO AND INTRAOCULAR LENS PLACEMENT (IOC) (Left) - DIABETIC - oral meds  Patient Location: PACU  Anesthesia Type: MAC  Level of Consciousness: awake, alert  and patient cooperative  Airway and Oxygen Therapy: Patient Spontanous Breathing and Patient connected to supplemental oxygen  Post-op Assessment: Post-op Vital signs reviewed, Patient's Cardiovascular Status Stable, Respiratory Function Stable, Patent Airway and No signs of Nausea or vomiting  Post-op Vital Signs: Reviewed and stable  Complications: No apparent anesthesia complications

## 2015-05-14 NOTE — Anesthesia Procedure Notes (Signed)
Procedure Name: MAC Performed by: Jaylnn Ullery Pre-anesthesia Checklist: Patient identified, Emergency Drugs available, Suction available, Timeout performed and Patient being monitored Patient Re-evaluated:Patient Re-evaluated prior to inductionOxygen Delivery Method: Nasal cannula Placement Confirmation: positive ETCO2     

## 2015-05-14 NOTE — Anesthesia Preprocedure Evaluation (Signed)
Anesthesia Evaluation  Patient identified by MRN, date of birth, ID band Patient awake    Reviewed: Allergy & Precautions, H&P , NPO status , Patient's Chart, lab work & pertinent test results, reviewed documented beta blocker date and time   Airway Mallampati: III  TM Distance: >3 FB Neck ROM: full    Dental no notable dental hx.    Pulmonary Current Smoker,    Pulmonary exam normal breath sounds clear to auscultation       Cardiovascular Exercise Tolerance: Good + CAD and + Past MI  Normal cardiovascular exam Rhythm:regular Rate:Normal     Neuro/Psych PSYCHIATRIC DISORDERS CVA negative neurological ROS  negative psych ROS   GI/Hepatic negative GI ROS, Neg liver ROS, GERD  Controlled,  Endo/Other  negative endocrine ROSdiabetesHypothyroidism   Renal/GU Renal disease     Musculoskeletal   Abdominal   Peds  Hematology negative hematology ROS (+)   Anesthesia Other Findings   Reproductive/Obstetrics negative OB ROS                             Anesthesia Physical Anesthesia Plan  ASA: III  Anesthesia Plan: MAC   Post-op Pain Management:    Induction:   Airway Management Planned:   Additional Equipment:   Intra-op Plan:   Post-operative Plan:   Informed Consent:   Plan Discussed with:   Anesthesia Plan Comments:         Anesthesia Quick Evaluation

## 2015-05-15 ENCOUNTER — Encounter: Payer: Self-pay | Admitting: Ophthalmology

## 2015-05-21 ENCOUNTER — Ambulatory Visit: Payer: Medicare Other | Attending: Pain Medicine | Admitting: Pain Medicine

## 2015-05-21 ENCOUNTER — Encounter: Payer: Self-pay | Admitting: Pain Medicine

## 2015-05-21 VITALS — BP 124/74 | HR 80 | Temp 97.7°F | Resp 16 | Ht 59.0 in | Wt 131.0 lb

## 2015-05-21 DIAGNOSIS — E119 Type 2 diabetes mellitus without complications: Secondary | ICD-10-CM | POA: Diagnosis not present

## 2015-05-21 DIAGNOSIS — F4321 Adjustment disorder with depressed mood: Secondary | ICD-10-CM | POA: Diagnosis not present

## 2015-05-21 DIAGNOSIS — M4806 Spinal stenosis, lumbar region: Secondary | ICD-10-CM | POA: Insufficient documentation

## 2015-05-21 DIAGNOSIS — M545 Low back pain: Secondary | ICD-10-CM | POA: Insufficient documentation

## 2015-05-21 DIAGNOSIS — Z79891 Long term (current) use of opiate analgesic: Secondary | ICD-10-CM

## 2015-05-21 DIAGNOSIS — Z87891 Personal history of nicotine dependence: Secondary | ICD-10-CM | POA: Diagnosis not present

## 2015-05-21 DIAGNOSIS — G8929 Other chronic pain: Secondary | ICD-10-CM | POA: Diagnosis not present

## 2015-05-21 DIAGNOSIS — F411 Generalized anxiety disorder: Secondary | ICD-10-CM | POA: Insufficient documentation

## 2015-05-21 DIAGNOSIS — M4804 Spinal stenosis, thoracic region: Secondary | ICD-10-CM | POA: Diagnosis not present

## 2015-05-21 DIAGNOSIS — Z86718 Personal history of other venous thrombosis and embolism: Secondary | ICD-10-CM | POA: Diagnosis not present

## 2015-05-21 DIAGNOSIS — M546 Pain in thoracic spine: Secondary | ICD-10-CM | POA: Insufficient documentation

## 2015-05-21 DIAGNOSIS — Z954 Presence of other heart-valve replacement: Secondary | ICD-10-CM | POA: Diagnosis not present

## 2015-05-21 DIAGNOSIS — Z7902 Long term (current) use of antithrombotics/antiplatelets: Secondary | ICD-10-CM | POA: Insufficient documentation

## 2015-05-21 DIAGNOSIS — I482 Chronic atrial fibrillation: Secondary | ICD-10-CM | POA: Diagnosis not present

## 2015-05-21 DIAGNOSIS — M47894 Other spondylosis, thoracic region: Secondary | ICD-10-CM | POA: Insufficient documentation

## 2015-05-21 DIAGNOSIS — M797 Fibromyalgia: Secondary | ICD-10-CM | POA: Diagnosis not present

## 2015-05-21 DIAGNOSIS — M47816 Spondylosis without myelopathy or radiculopathy, lumbar region: Secondary | ICD-10-CM | POA: Diagnosis not present

## 2015-05-21 DIAGNOSIS — Z8673 Personal history of transient ischemic attack (TIA), and cerebral infarction without residual deficits: Secondary | ICD-10-CM | POA: Insufficient documentation

## 2015-05-21 DIAGNOSIS — N189 Chronic kidney disease, unspecified: Secondary | ICD-10-CM | POA: Insufficient documentation

## 2015-05-21 DIAGNOSIS — I2510 Atherosclerotic heart disease of native coronary artery without angina pectoris: Secondary | ICD-10-CM | POA: Diagnosis not present

## 2015-05-21 DIAGNOSIS — M791 Myalgia: Secondary | ICD-10-CM

## 2015-05-21 DIAGNOSIS — R634 Abnormal weight loss: Secondary | ICD-10-CM | POA: Diagnosis not present

## 2015-05-21 DIAGNOSIS — Z951 Presence of aortocoronary bypass graft: Secondary | ICD-10-CM | POA: Diagnosis not present

## 2015-05-21 DIAGNOSIS — Z96653 Presence of artificial knee joint, bilateral: Secondary | ICD-10-CM | POA: Diagnosis not present

## 2015-05-21 DIAGNOSIS — F329 Major depressive disorder, single episode, unspecified: Secondary | ICD-10-CM | POA: Insufficient documentation

## 2015-05-21 DIAGNOSIS — M7918 Myalgia, other site: Secondary | ICD-10-CM

## 2015-05-21 DIAGNOSIS — Z9049 Acquired absence of other specified parts of digestive tract: Secondary | ICD-10-CM | POA: Insufficient documentation

## 2015-05-21 DIAGNOSIS — Z5181 Encounter for therapeutic drug level monitoring: Secondary | ICD-10-CM

## 2015-05-21 DIAGNOSIS — M4856XA Collapsed vertebra, not elsewhere classified, lumbar region, initial encounter for fracture: Secondary | ICD-10-CM | POA: Diagnosis not present

## 2015-05-21 DIAGNOSIS — M4316 Spondylolisthesis, lumbar region: Secondary | ICD-10-CM | POA: Insufficient documentation

## 2015-05-21 MED ORDER — FENTANYL 75 MCG/HR TD PT72: 75.0000 ug | MEDICATED_PATCH | TRANSDERMAL | Status: AC

## 2015-05-21 MED ORDER — CYCLOBENZAPRINE HCL 5 MG PO TABS: 5.0000 mg | ORAL_TABLET | Freq: Three times a day (TID) | ORAL | Status: AC | PRN

## 2015-05-21 NOTE — Progress Notes (Signed)
Safety precautions to be maintained throughout the outpatient stay will include: orient to surroundings, keep bed in low position, maintain call bell within reach at all times, provide assistance with transfer out of bed and ambulation.  Did not bring patches- states she has about three remaining

## 2015-05-21 NOTE — Progress Notes (Signed)
Patient's Name: Pamela Mcmahon  Patient type: Established  MRN: [id number]  Service setting: Ambulatory outpatient  DOB: 1945-09-22  Location: [facility]  DOS: 05/21/2015  Primary Care Physician: Rafael Bihari, MD  Note by: Sydnee Levans Laban Emperor, M.D, DABA, DABAPM, DABPM, DABIPP, FIPP  Referring Physician: Rafael Bihari, MD  Specialty: Board-Certified Interventional Pain Management  Last Visit to Pain Management: 04/05/2015   Primary Reason(s) for Visit: Encounter for prescription drug management (Level of risk: moderate) CC: Muscle Pain   HPI  Pamela Mcmahon is a 70 y.o. year old, female patient, who returns today as an established patient. She has Diabetes (HCC); Weight loss; Grief; Arteriosclerosis of coronary artery; Chronic kidney disease; Clinical depression; Diabetes mellitus, type 2 (HCC); Fibrositis; Anxiety, generalized; Cerebrovascular accident, old; HLD (hyperlipidemia); Abdominal apron; Abuse, drug or alcohol; Chronic pain; Long term current use of opiate analgesic; Long term prescription opiate use; Opiate use (180 MME/Day); Opiate dependence (HCC); Encounter for therapeutic drug level monitoring; Fibromyalgia; Type 2 diabetes mellitus (HCC); Current use of long term anticoagulation (Plavix); History of thromboembolism; Chronic atrial fibrillation (HCC); History of heart valve replacement with mechanical valve; Chronic upper back pain; Thoracic spondylosis; 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 Her primarily concern today is the Muscle Pain   Pain Assessment: Self-Reported Pain Score: 8 , clinically she looks like a 2-3/10. Reported level is inconsistent with clinical obrservations Pain Type: Chronic pain Pain Location: Foot (muscle pain from fibro.- ) Pain Orientation: Right, Left Pain Descriptors / Indicators: Aching, Constant, Burning (neuropathy)  The patient comes into the clinics today for pharmacological management of her chronic pain. I last saw this patient on 04/05/2015. Her body mass index is 26.44 kg/(m^2). The patient  reports that she does not use illicit drugs.  Date of Last Visit: 03/01/15 Service Provided on Last Visit: Med Refill  Controlled Substance Pharmacotherapy Assessment & REMS (Risk Evaluation and Mitigation Strategy)  Analgesic: Fentanyl patch 75 g per hour every 72 hours Pill Count: Please see the nurse's note for latest pill count information. MME/day: 180 mg/day Date of Last Visit: 03/01/15 Pharmacokinetics: Onset of action (Liberation/Absorption): Within expected pharmacological parameters Time to Peak effect (Distribution): Timing and results are as within normal expected parameters Duration of action (Metabolism/Excretion): Within normal limits for medication Pharmacodynamics: Analgesic Effect: More than 50% Activity Facilitation: Medication(s) allow patient to sit, stand, walk, and do the basic ADLs Perceived Effectiveness: Described as relatively effective, allowing for increase in activities of daily living (ADL) Side-effects or Adverse reactions: None reported Monitoring: [place] PMP: Online review of the past 31-month period conducted. Compliant with practice rules and regulations UDS Results/interpretation: Last UDS done on 03/01/2015 came back within normal limits with no unexpected results. Medication Assessment Form: Reviewed. Patient indicates being compliant with therapy Treatment compliance:  Compliant Risk Assessment: Aberrant Behavior: None observed today Substance Use Disorder (SUD) Risk Level: No change since last visit Risk of opioid abuse or dependence: 0.7-3.0% with doses ? 36 MME/day and 6.1-26% with doses ? 120 MME/day. Opioid Risk Tool (ORT) Score: Total Score: 1 Low  Risk for SUD (Score <3) Depression Scale Score: PHQ-2: PHQ-2 Total Score: 3 38.4% Probability of major depressive disorder (3) PHQ-9: PHQ-9 Total Score: 10 Moderate depression (10-14)  Pharmacologic Plan: No change in therapy, at this time  Laboratory Chemistry  Inflammation Markers Lab Results  Component Value Date   ESRSEDRATE 6 10/22/2011    Renal Function Lab Results  Component Value Date   BUN 18 06/20/2014   CREATININE 1.64* 06/20/2014   GFRAA 36* 06/20/2014   GFRNONAA 31* 06/20/2014    Hepatic Function Lab Results  Component Value Date   AST 24 06/20/2014   ALT 11* 06/20/2014   ALBUMIN 3.9 06/20/2014    Electrolytes Lab Results  Component Value Date   NA 138 06/20/2014   K 3.5 06/20/2014   CL 97* 06/20/2014   CALCIUM 9.4 06/20/2014   MG 2.5* 03/04/2012    Pain Modulating Vitamins No results found for: VD25OH, VD125OH2TOT, ZO1096EA5, WU9811BJ4, VITAMINB12  Coagulation Parameters Lab Results  Component Value Date   INR 1.0 04/06/2013   LABPROT 12.9 04/06/2013    Note: I personally reviewed the above data. Results made available to patient.  Recent Diagnostic Imaging  No results found.  Meds  The patient has a current medication list which includes the following prescription(s): albuterol, alprazolam, amitriptyline, aspirin ec, vitamin d3, clopidogrel, furosemide, glucose blood, isosorbide dinitrate, levothyroxine, metoprolol, miglitol, naloxone hcl, nitroglycerin, omeprazole, onetouch delica lancets 33g, potassium chloride sa, simvastatin, zolpidem, cyclobenzaprine, fentanyl, fentanyl, and fentanyl.  Current Outpatient Prescriptions on File Prior to Visit   Medication Sig  . albuterol (PROAIR HFA) 108 (90 BASE) MCG/ACT inhaler Inhale 2 puffs into the lungs every 6 (six) hours as needed for wheezing.   Marland Kitchen ALPRAZolam (XANAX) 0.5 MG tablet Take 0.5 mg by mouth 3 (three) times daily as needed for anxiety.  Marland Kitchen amitriptyline (ELAVIL) 100 MG tablet Take 1 tablet (100 mg total) by mouth at bedtime.  Marland Kitchen aspirin EC 81 MG tablet Take 81 mg by mouth daily.  . Cholecalciferol (VITAMIN D3) 2000 UNITS TABS Take 2,000 Int'l Units by mouth daily.  . clopidogrel (PLAVIX) 75 MG tablet Take 75 mg by mouth daily.  . furosemide (LASIX) 40 MG tablet Take 40 mg by mouth daily.  Marland Kitchen glucose blood (ONE TOUCH ULTRA TEST) test strip Use once daily. DX 250.00  . isosorbide dinitrate (ISORDIL) 30 MG tablet Take 30 mg by mouth daily.  Marland Kitchen levothyroxine (SYNTHROID, LEVOTHROID) 50 MCG tablet Take 50 mcg by mouth daily before breakfast.  . metoprolol (LOPRESSOR) 100 MG tablet Take 100 mg by mouth daily.  . miglitol (GLYSET) 50 MG tablet Take 50 mg by mouth 3 (three) times daily with meals.  . Naloxone HCl (NARCAN) 4 MG/0.1ML LIQD Place 1 spray into the nose every 5 (five) minutes as needed. Spray half of the content into each nostril. Call 911.  . nitroGLYCERIN (NITROLINGUAL) 0.4 MG/SPRAY spray Place 1 spray under the tongue every 5 (five) minutes x 3 doses as needed for chest pain.  Marland Kitchen omeprazole (PRILOSEC) 40 MG capsule Take 40 mg by mouth daily.  Letta Pate DELICA LANCETS 33G MISC Use 1 Units once daily.  . potassium chloride SA (K-DUR,KLOR-CON) 20 MEQ tablet Take 10 mEq by mouth 2 (two) times daily. Take 1/2 tablet in the morning and 1/2 tablet in the evening.  . simvastatin (ZOCOR) 40 MG tablet Take 40 mg by mouth at bedtime.  Marland Kitchen zolpidem (AMBIEN) 10 MG tablet Take 10 mg by mouth at bedtime.   No current  facility-administered medications on file prior to visit.    ROS  Constitutional: Denies any fever or chills Gastrointestinal: No reported hemesis, hematochezia, vomiting, or  acute GI distress Musculoskeletal: Denies any acute onset joint swelling, redness, loss of ROM, or weakness Neurological: No reported episodes of acute onset apraxia, aphasia, dysarthria, agnosia, amnesia, paralysis, loss of coordination, or loss of consciousness  Allergies  Ms. Fee is allergic to codeine; codeine sulfate; diazepam; gemfibrozil; lyrica; morphine and related; tricor; and vicodin.  PFSH  Medical:  Ms. Melichar  has a past medical history of Diabetes mellitus without complication (HCC); Neuropathy (HCC); Chronic hip pain; History of thromboembolism (11/20/2014); History of heart valve replacement with mechanical valve (11/20/2014); Stroke [facility]) (2013); Shortness of breath dyspnea; Seizures (HCC) (1993); Myocardial infarction (HCC) (1996); Fibromyalgia; GERD (gastroesophageal reflux disease); Vertigo; and Hypothyroidism. Family: family history includes Suicidality in her other. Surgical:  has past surgical history that includes Cholecystectomy; Cardiac surgery; Cardiac catheterization; Coronary artery bypass graft (2007, 2012); Carotid endarterectomy; Carpal tunnel release; De Quervain's release; Abdominal hysterectomy (1975); Replacement total knee (Left, 04/04/09); Replacement total knee (Right, 11/05/07); Cataract extraction w/PHACO (Right, 04/02/2015); and Cataract extraction w/PHACO (Left, 05/14/2015). Tobacco:  reports that she quit smoking about 14 months ago. Her smoking use included Cigarettes. She has a 25 pack-year smoking history. She does not have any smokeless tobacco history on file. Alcohol:  reports that she does not drink alcohol. Drug:  reports that she does not use illicit drugs.  Constitutional Exam  Vitals: Blood pressure 124/74, pulse 80, temperature 97.7 F (36.5 C), temperature source Oral, resp. rate 16, height 4\' 11"  (1.499 m), weight 131 lb (59.421 kg), SpO2 97 %. General appearance: Well nourished, well developed, and well hydrated. In no acute  distress Calculated BMI/Body habitus: Body mass index is 26.44 kg/(m^2). (25-29.9 kg/m2) Overweight - 20% higher incidence of chronic pain Psych/Mental status: Alert and oriented x 3 (person, place, & time) Eyes: PERLA Respiratory: No evidence of acute respiratory distress  Cervical Spine Exam  Inspection: No masses, redness, or swelling Alignment: Symmetrical ROM: Functional: Adequate ROM Active: Unrestricted ROM Stability: No instability detected Muscle strength & Tone: Functionally intact Sensory: Unimpaired Palpation: No complaints of tenderness  Upper Extremity (UE) Exam    Side: Right upper extremity  Side: Left upper extremity  Inspection: No masses, redness, swelling, or asymmetry  Inspection: No masses, redness, swelling, or asymmetry  ROM:  ROM:  Functional: Adequate ROM  Functional: Adequate ROM  Active: Unrestricted ROM  Active: Unrestricted ROM  Muscle strength & Tone: Functionally intact  Muscle strength & Tone: Functionally intact  Sensory: Unimpaired  Sensory: Unimpaired  Palpation: Non-contributory  Palpation: Non-contributory   Thoracic Spine Exam  Inspection: No masses, redness, or swelling Alignment: Symmetrical ROM: Functional: Adequate ROM Active: Unrestricted ROM Stability: No instability detected Sensory: Unimpaired Muscle strength & Tone: Functionally intact Palpation: No complaints of tenderness  Lumbar Spine Exam  Inspection: No masses, redness, or swelling Alignment: Symmetrical ROM: Functional: Adequate ROM Active: Unrestricted ROM Stability: No instability detected Muscle strength & Tone: Functionally intact Sensory: Unimpaired Palpation: No complaints of tenderness Provocative Tests: Lumbar Hyperextension and rotation test: deferred Patrick's Maneuver: deferred  Gait & Posture Assessment  Gait: Unaffected Posture: WNL  Lower Extremity Exam    Side: Right lower extremity  Side: Left lower extremity  Inspection: No masses,  redness, swelling, or asymmetry ROM:  Inspection: No masses, redness, swelling, or asymmetry ROM:  Functional: Adequate ROM  Functional: Adequate ROM  Active: Unrestricted ROM  Active:  Unrestricted ROM  Muscle strength & Tone: Functionally intact  Muscle strength & Tone: Functionally intact  Sensory: Unimpaired  Sensory: Unimpaired  Palpation: Non-contributory  Palpation: Non-contributory   Assessment & Plan  Primary Diagnosis & Pertinent Problem List: The primary encounter diagnosis was Chronic pain. Diagnoses of Encounter for therapeutic drug level monitoring, Long term current use of opiate analgesic, Fibromyalgia, Chronic low back pain, and Musculoskeletal pain were also pertinent to this visit.  Visit Diagnosis: 1. Chronic pain   2. Encounter for therapeutic drug level monitoring   3. Long term current use of opiate analgesic   4. Fibromyalgia   5. Chronic low back pain   6. Musculoskeletal pain     Problems updated and reviewed during this visit: Problem  Musculoskeletal Pain    Problem-specific Plan(s): No problem-specific assessment & plan notes found for this encounter.  No new assessment & plan notes have been filed under this hospital service since the last note was generated. Service: Pain Management   Plan of Care   Problem List Items Addressed This Visit      High   Chronic low back pain (Chronic)   Relevant Medications   cyclobenzaprine (FLEXERIL) 5 MG tablet   fentaNYL (DURAGESIC - DOSED MCG/HR) 75 MCG/HR   fentaNYL (DURAGESIC - DOSED MCG/HR) 75 MCG/HR   fentaNYL (DURAGESIC - DOSED MCG/HR) 75 MCG/HR   Chronic pain - Primary (Chronic)   Relevant Medications   cyclobenzaprine (FLEXERIL) 5 MG tablet   fentaNYL (DURAGESIC - DOSED MCG/HR) 75 MCG/HR   fentaNYL (DURAGESIC - DOSED MCG/HR) 75 MCG/HR   fentaNYL (DURAGESIC - DOSED MCG/HR) 75 MCG/HR   Other Relevant Orders   Comprehensive metabolic panel   C-reactive protein   Magnesium   Sedimentation rate    Vitamin B12   25-Hydroxyvitamin D Lcms D2+D3   Fibromyalgia (Chronic)   Musculoskeletal pain (Chronic)   Relevant Medications   cyclobenzaprine (FLEXERIL) 5 MG tablet     Medium   Encounter for therapeutic drug level monitoring   Long term current use of opiate analgesic (Chronic)   Relevant Orders   ToxASSURE Select 13 (MW), Urine       Pharmacotherapy (Medications Ordered): Meds ordered this encounter  Medications  . cyclobenzaprine (FLEXERIL) 5 MG tablet    Sig: Take 1 tablet (5 mg total) by mouth 3 (three) times daily as needed for muscle spasms.    Dispense:  90 tablet    Refill:  2    Do not place this medication, or any other prescription from our practice, on "Automatic Refill". Patient may have prescription filled one day early if pharmacy is closed on scheduled refill date.  . fentaNYL (DURAGESIC - DOSED MCG/HR) 75 MCG/HR    Sig: Place 1 patch (75 mcg total) onto the skin every 3 (three) days.    Dispense:  10 patch    Refill:  0    Do not place this medication, or any other prescription from our practice, on "Automatic Refill". Patient may have prescription filled one day early if pharmacy is closed on scheduled refill date. Do not fill until: 06/10/15 To last until: 07/10/15  . fentaNYL (DURAGESIC - DOSED MCG/HR) 75 MCG/HR    Sig: Place 1 patch (75 mcg total) onto the skin every 3 (three) days.    Dispense:  10 patch    Refill:  0    Do not place this medication, or any other prescription from our practice, on "Automatic Refill". Patient may have prescription filled one  day early if pharmacy is closed on scheduled refill date. Do not fill until: 07/10/15 To last until: 08/09/15  . fentaNYL (DURAGESIC - DOSED MCG/HR) 75 MCG/HR    Sig: Place 1 patch (75 mcg total) onto the skin every 3 (three) days.    Dispense:  10 patch    Refill:  0    Do not place this medication, or any other prescription from our practice, on "Automatic Refill". Patient may have  prescription filled one day early if pharmacy is closed on scheduled refill date. Do not fill until: 08/09/15 To last until: 09/08/15    [facility] & Procedure Ordered: Orders Placed This Encounter  Procedures  . ToxASSURE Select 13 (MW), Urine  . Comprehensive metabolic panel  . C-reactive protein  . Magnesium  . Sedimentation rate  . Vitamin B12  . 25-Hydroxyvitamin D Lcms D2+D3    Imaging Ordered: None  Interventional Therapies: Scheduled:  None at this point.    Considering:  None at this point.    PRN Procedures:  None at this point.    Referral(s) or Consult(s): None at this time.  New Prescriptions   No medications on file    Medications administered during this visit: Ms. Charlie had no medications administered during this visit.  Requested PM Follow-up: Return in about 3 months (around 08/20/2015) for Medication Management, (3-Mo).  Future Appointments Date Time Provider Department Center  08/20/2015 1:20 PM Delano Metz, MD [facility] None    Primary Care Physician: Rafael Bihari, MD Location: [facility] Note by: Sydnee Levans Laban Emperor, M.D, DABA, DABAPM, DABPM, DABIPP, FIPP  Pain Score Disclaimer: We use the NRS-11 scale. This is a self-reported, subjective measurement of pain severity with only modest accuracy. It is used primarily to identify changes within a particular patient. It must be understood that outpatient pain scales are significantly less accurate that those used for research, where they can be applied under ideal controlled circumstances with minimal exposure to variables. In reality, the score is likely to be a combination of pain intensity and pain affect, where pain affect describes the degree of emotional arousal or changes in action readiness caused by the sensory experience of pain. Factors such as social and work situation, setting, emotional state, anxiety levels, expectation, and prior pain experience may  influence pain perception and show large inter-individual differences that may also be affected by time variables.  Patient instructions provided during this appointment: There are no Patient Instructions on file for this visit.

## 2015-05-22 ENCOUNTER — Encounter: Payer: Self-pay | Admitting: Pain Medicine

## 2015-05-26 LAB — TOXASSURE SELECT 13 (MW), URINE

## 2015-05-28 ENCOUNTER — Encounter: Payer: Self-pay | Admitting: Pain Medicine

## 2015-06-19 ENCOUNTER — Other Ambulatory Visit: Payer: Self-pay | Admitting: Internal Medicine

## 2015-06-19 DIAGNOSIS — N6320 Unspecified lump in the left breast, unspecified quadrant: Secondary | ICD-10-CM

## 2015-06-28 ENCOUNTER — Other Ambulatory Visit: Payer: Self-pay

## 2015-06-28 ENCOUNTER — Ambulatory Visit: Payer: Medicare Other

## 2015-07-18 ENCOUNTER — Ambulatory Visit
Admission: RE | Admit: 2015-07-18 | Discharge: 2015-07-18 | Disposition: A | Discharge status: Home / Self Care | Payer: Medicare Other | Source: Ambulatory Visit | Attending: Internal Medicine | Admitting: Internal Medicine

## 2015-07-18 ENCOUNTER — Other Ambulatory Visit: Payer: Self-pay | Admitting: Internal Medicine

## 2015-07-18 DIAGNOSIS — N63 Unspecified lump in breast: Secondary | ICD-10-CM | POA: Insufficient documentation

## 2015-07-18 DIAGNOSIS — N6320 Unspecified lump in the left breast, unspecified quadrant: Secondary | ICD-10-CM

## 2015-08-07 IMAGING — CT CT PELVIS W/O CM
1 series · 15 of 32 positions shown, 19 images · non-contrast
Comparison: CT 09/23/2010

CLINICAL DATA: Patient diagnosed with bursitis yesterday and has
been having pain for 8 days. Pain began on left side and now has
moved to right side radiating to right groin.

EXAM:
CT PELVIS WITHOUT CONTRAST
TECHNIQUE: Multidetector CT imaging of the pelvis was performed following the
standard protocol without intravenous contrast.

[Series 5: soft tissue axials · axial · 0.63mm/px · z∈[-1478,-1268]mm · 15 of 117 slices shown, 19 images]
[im 8/117  soft-tissue]
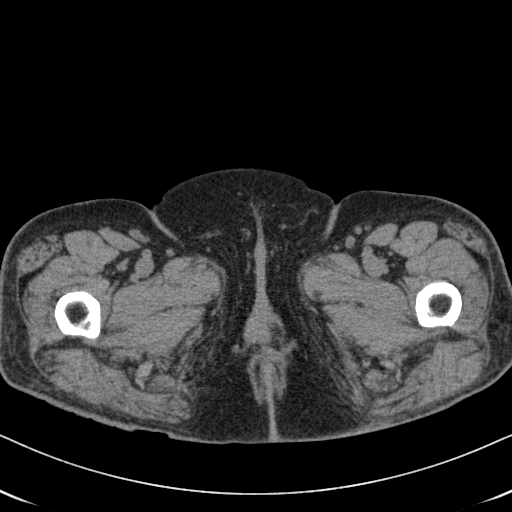
[im 8/117  bone]
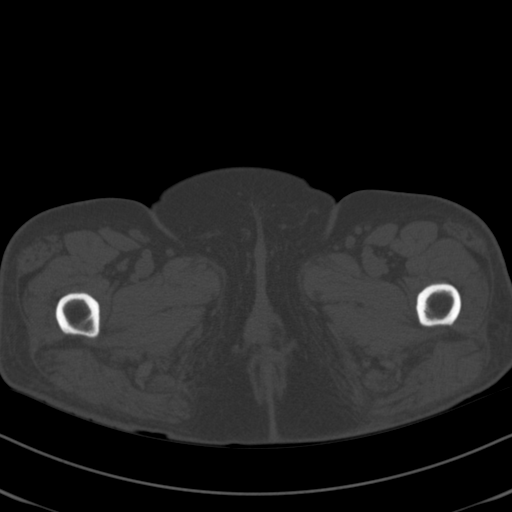
[im 15/117  soft-tissue]
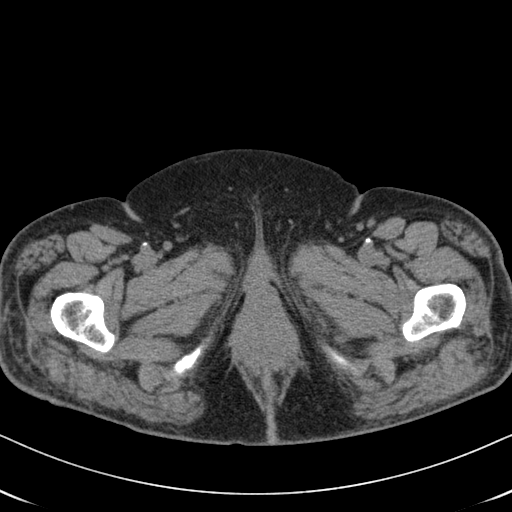
[im 23/117  soft-tissue]
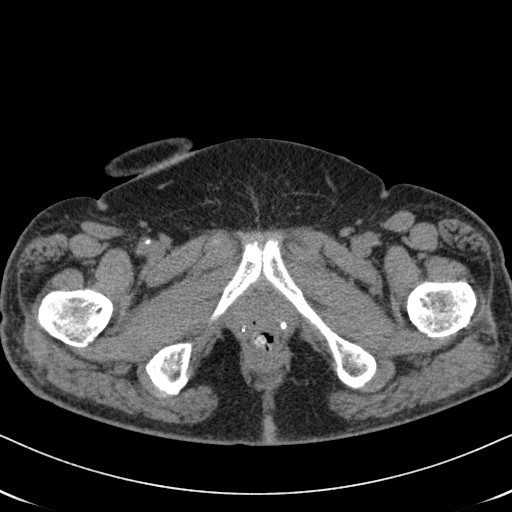
[im 34/117  soft-tissue]
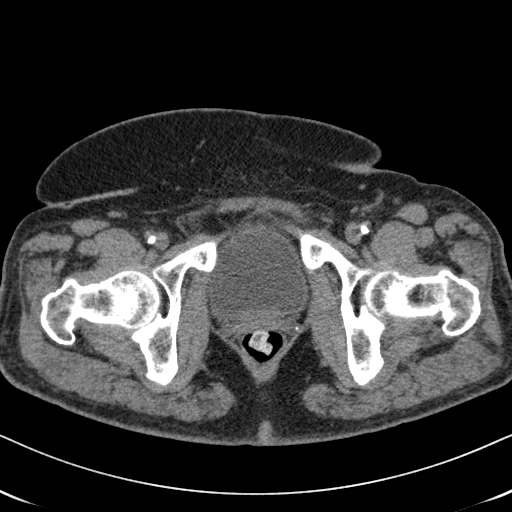
[im 42/117  soft-tissue]
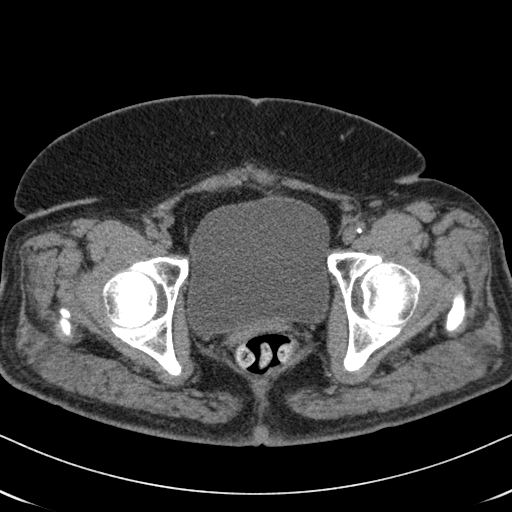
[im 49/117  soft-tissue]
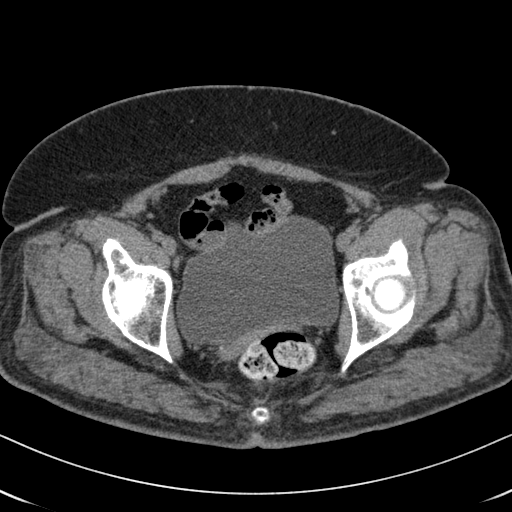
[im 60/117  soft-tissue]
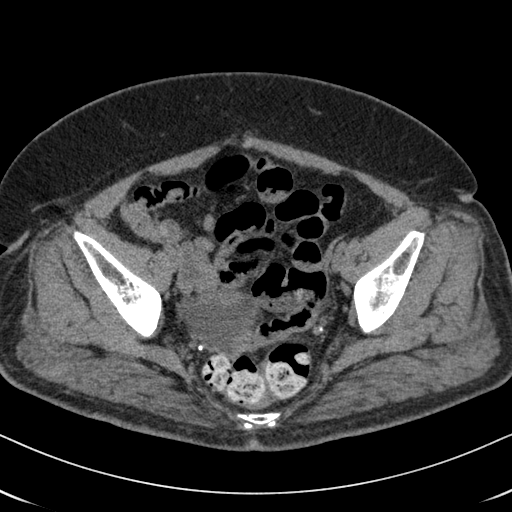
[im 68/117  soft-tissue]
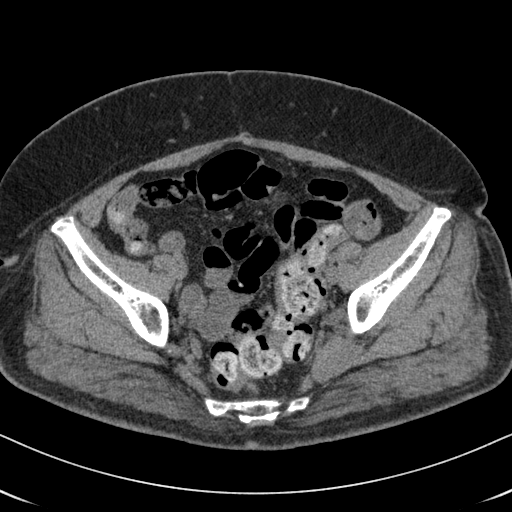
[im 75/117  soft-tissue]
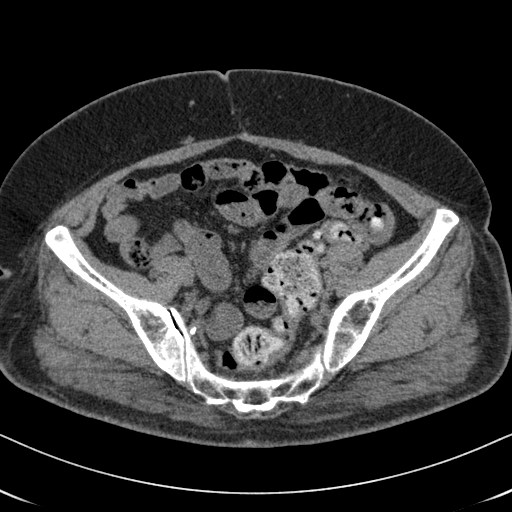
[im 75/117  bone]
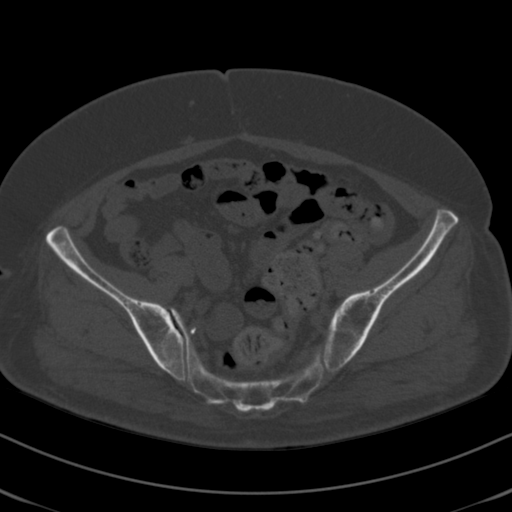
[im 83/117  soft-tissue]
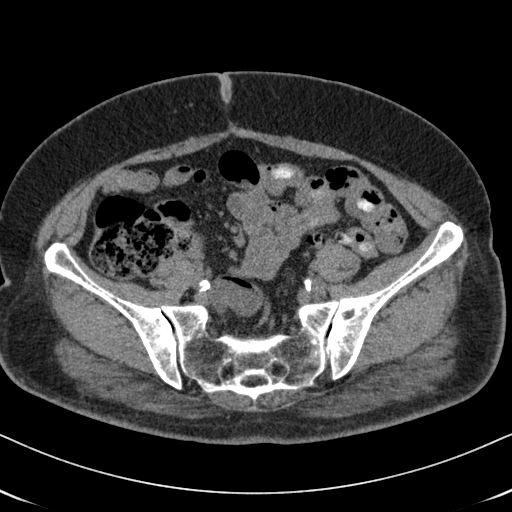
[im 94/117  soft-tissue]
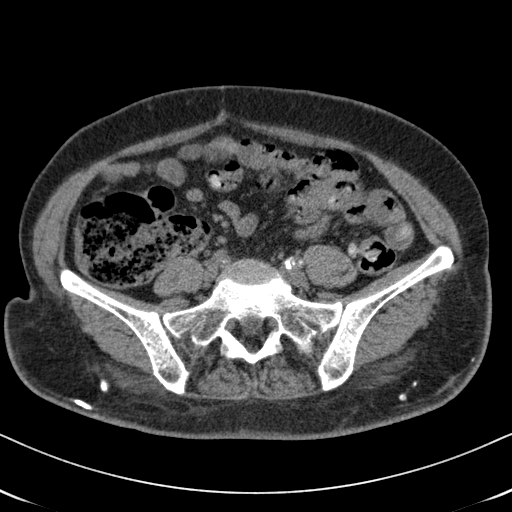
[im 102/117  soft-tissue]
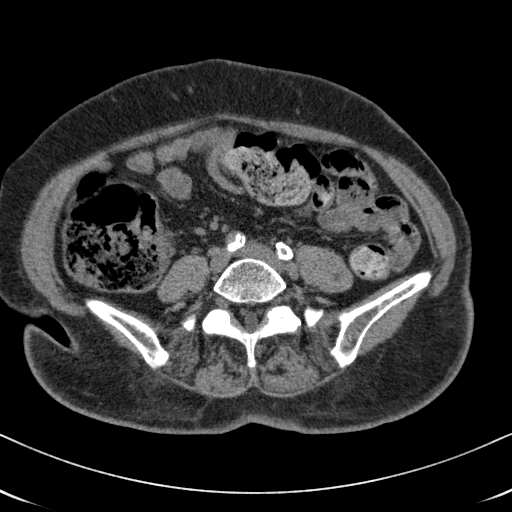
[im 102/117  lung]
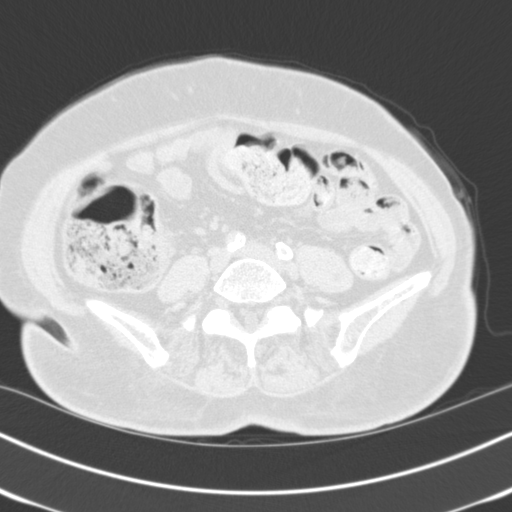
[im 105/117  lung]
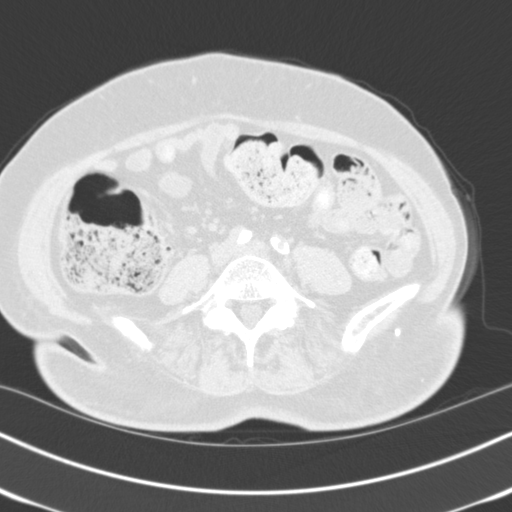
[im 109/117  soft-tissue]
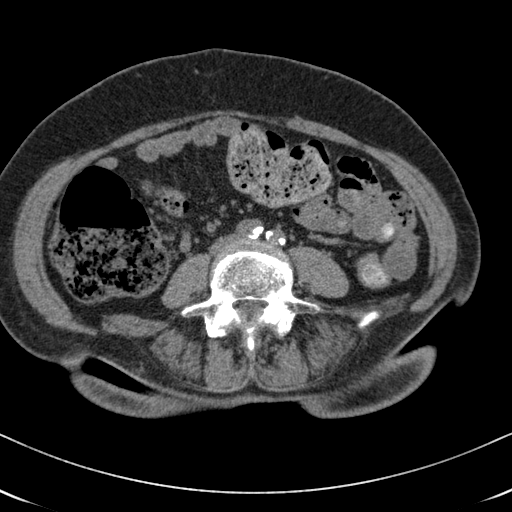
[im 109/117  lung]
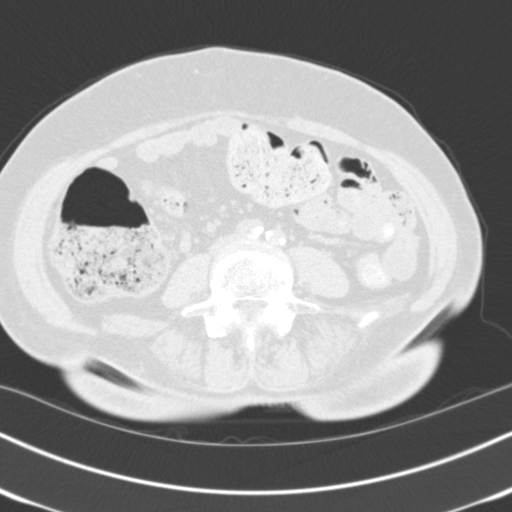
[im 113/117  lung]
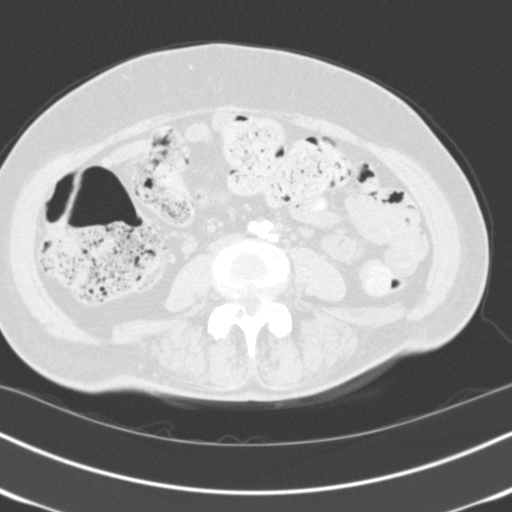

[15 of 32 positions shown; findings below may reference images not displayed]

FINDINGS: There is moderate fecal retention within the visualize colon.
Appendix is not seen. Minimal diverticulosis of the sigmoid colon
without active inflammation. There is no focal inflammatory change
or free fluid noted. Visualize small bowel within normal. Bladder
and rectum are within normal. Evidence of previous hysterectomy.

Examination demonstrates mild degenerate change of the spine and
hips. There is no acute fracture or dislocation. There is no focal
soft tissue mass or fluid collection. There is a stable grade 1
anterolisthesis of L4 on L5.
IMPRESSION: No acute findings.

Stable grade 1 anterolisthesis of L4 on L5. Mild degenerate change
of the spine and hips.

Diverticulosis of the colon.

## 2015-08-20 ENCOUNTER — Encounter: Payer: Self-pay | Admitting: Pain Medicine

## 2015-08-27 ENCOUNTER — Telehealth: Payer: Self-pay | Admitting: Pain Medicine

## 2015-08-27 NOTE — Telephone Encounter (Signed)
Mrs. Pamela Mcmahon called to say she found [place] and asked that we send her referral down to them. Said to tell Dr. Laban EmperorNaveira and Eli PhillipsKori hello. And that down there is big changes
